# Patient Record
Sex: Female | Born: 1985 | Race: Black or African American | Hispanic: No | Marital: Single | Smoking: Never smoker
Health system: Southern US, Community
[De-identification: ages and names within clinical notes are randomized; demographics above are authoritative.]

## PROBLEM LIST (undated history)

## (undated) ENCOUNTER — Inpatient Hospital Stay (HOSPITAL_COMMUNITY): Payer: Self-pay

## (undated) DIAGNOSIS — R51 Headache: Secondary | ICD-10-CM

## (undated) DIAGNOSIS — R011 Cardiac murmur, unspecified: Secondary | ICD-10-CM

## (undated) DIAGNOSIS — G8929 Other chronic pain: Secondary | ICD-10-CM

## (undated) DIAGNOSIS — E876 Hypokalemia: Secondary | ICD-10-CM

## (undated) DIAGNOSIS — M67439 Ganglion, unspecified wrist: Secondary | ICD-10-CM

## (undated) HISTORY — DX: Hypokalemia: E87.6

## (undated) HISTORY — PX: NO PAST SURGERIES: SHX2092

---

## 2010-04-27 ENCOUNTER — Ambulatory Visit: Payer: Self-pay | Admitting: Radiology

## 2010-04-27 ENCOUNTER — Emergency Department (HOSPITAL_BASED_OUTPATIENT_CLINIC_OR_DEPARTMENT_OTHER): Admission: EM | Admit: 2010-04-27 | Discharge: 2010-04-27 | Payer: Self-pay | Admitting: Rheumatology

## 2010-12-02 LAB — URINE CULTURE

## 2010-12-02 LAB — URINALYSIS, ROUTINE W REFLEX MICROSCOPIC
Hgb urine dipstick: NEGATIVE
Urobilinogen, UA: 1 mg/dL (ref 0.0–1.0)
pH: 6 (ref 5.0–8.0)

## 2010-12-02 LAB — PREGNANCY, URINE: Preg Test, Ur: NEGATIVE

## 2010-12-02 LAB — URINE MICROSCOPIC-ADD ON

## 2011-01-27 ENCOUNTER — Emergency Department (HOSPITAL_BASED_OUTPATIENT_CLINIC_OR_DEPARTMENT_OTHER)
Admission: EM | Admit: 2011-01-27 | Discharge: 2011-01-27 | Disposition: A | Discharge status: Home / Self Care | Payer: Self-pay | Attending: Emergency Medicine | Admitting: Emergency Medicine

## 2011-01-27 DIAGNOSIS — A499 Bacterial infection, unspecified: Secondary | ICD-10-CM | POA: Insufficient documentation

## 2011-01-27 DIAGNOSIS — B9689 Other specified bacterial agents as the cause of diseases classified elsewhere: Secondary | ICD-10-CM | POA: Insufficient documentation

## 2011-01-27 DIAGNOSIS — N760 Acute vaginitis: Secondary | ICD-10-CM | POA: Insufficient documentation

## 2011-01-27 LAB — URINALYSIS, ROUTINE W REFLEX MICROSCOPIC
Bilirubin Urine: NEGATIVE
Glucose, UA: NEGATIVE mg/dL
Hgb urine dipstick: NEGATIVE
Ketones, ur: NEGATIVE mg/dL
Nitrite: NEGATIVE
Protein, ur: NEGATIVE mg/dL
Specific Gravity, Urine: 1.017 (ref 1.005–1.030)
Urobilinogen, UA: 0.2 mg/dL (ref 0.0–1.0)
pH: 7 (ref 5.0–8.0)

## 2011-01-27 LAB — WET PREP, GENITAL
Trich, Wet Prep: NONE SEEN
Yeast Wet Prep HPF POC: NONE SEEN

## 2011-01-27 LAB — PREGNANCY, URINE: Preg Test, Ur: NEGATIVE

## 2011-01-27 LAB — URINE MICROSCOPIC-ADD ON

## 2011-01-28 LAB — GC/CHLAMYDIA PROBE AMP, GENITAL
Chlamydia, DNA Probe: NEGATIVE
GC Probe Amp, Genital: NEGATIVE

## 2011-03-05 ENCOUNTER — Emergency Department (INDEPENDENT_AMBULATORY_CARE_PROVIDER_SITE_OTHER): Payer: Self-pay

## 2011-03-05 ENCOUNTER — Emergency Department (HOSPITAL_BASED_OUTPATIENT_CLINIC_OR_DEPARTMENT_OTHER)
Admission: EM | Admit: 2011-03-05 | Discharge: 2011-03-05 | Disposition: A | Discharge status: Home / Self Care | Payer: Self-pay | Attending: Emergency Medicine | Admitting: Emergency Medicine

## 2011-03-05 DIAGNOSIS — R6884 Jaw pain: Secondary | ICD-10-CM

## 2011-03-05 DIAGNOSIS — M542 Cervicalgia: Secondary | ICD-10-CM | POA: Insufficient documentation

## 2011-08-27 ENCOUNTER — Emergency Department (HOSPITAL_BASED_OUTPATIENT_CLINIC_OR_DEPARTMENT_OTHER)
Admission: EM | Admit: 2011-08-27 | Discharge: 2011-08-27 | Discharge status: Against Medical Advice | Payer: PRIVATE HEALTH INSURANCE | Attending: Emergency Medicine | Admitting: Emergency Medicine

## 2011-08-27 ENCOUNTER — Encounter: Payer: Self-pay | Admitting: *Deleted

## 2011-08-27 DIAGNOSIS — R109 Unspecified abdominal pain: Secondary | ICD-10-CM | POA: Insufficient documentation

## 2011-08-27 NOTE — ED Notes (Addendum)
Abd pain all day. States its not crampy or sharp. States that she has been nauseas today. Hasn't had a period in 3 months and is not currently on [facility]. Denies any other symptoms.

## 2011-09-02 ENCOUNTER — Encounter (HOSPITAL_BASED_OUTPATIENT_CLINIC_OR_DEPARTMENT_OTHER): Payer: Self-pay | Admitting: *Deleted

## 2011-09-02 ENCOUNTER — Emergency Department (HOSPITAL_BASED_OUTPATIENT_CLINIC_OR_DEPARTMENT_OTHER)
Admission: EM | Admit: 2011-09-02 | Discharge: 2011-09-02 | Disposition: A | Discharge status: Home / Self Care | Payer: PRIVATE HEALTH INSURANCE | Attending: Emergency Medicine | Admitting: Emergency Medicine

## 2011-09-02 DIAGNOSIS — B9689 Other specified bacterial agents as the cause of diseases classified elsewhere: Secondary | ICD-10-CM | POA: Insufficient documentation

## 2011-09-02 DIAGNOSIS — N390 Urinary tract infection, site not specified: Secondary | ICD-10-CM | POA: Insufficient documentation

## 2011-09-02 DIAGNOSIS — N760 Acute vaginitis: Secondary | ICD-10-CM | POA: Insufficient documentation

## 2011-09-02 DIAGNOSIS — A499 Bacterial infection, unspecified: Secondary | ICD-10-CM | POA: Insufficient documentation

## 2011-09-02 DIAGNOSIS — R109 Unspecified abdominal pain: Secondary | ICD-10-CM | POA: Insufficient documentation

## 2011-09-02 LAB — URINALYSIS, ROUTINE W REFLEX MICROSCOPIC
Glucose, UA: NEGATIVE mg/dL
Ketones, ur: 15 mg/dL — AB
Specific Gravity, Urine: 1.039 — ABNORMAL HIGH (ref 1.005–1.030)
Urobilinogen, UA: 1 mg/dL (ref 0.0–1.0)

## 2011-09-02 LAB — URINE MICROSCOPIC-ADD ON

## 2011-09-02 LAB — WET PREP, GENITAL
Trich, Wet Prep: NONE SEEN
Yeast Wet Prep HPF POC: NONE SEEN

## 2011-09-02 MED ORDER — METRONIDAZOLE 500 MG PO TABS: 500.0000 mg | ORAL_TABLET | Freq: Two times a day (BID) | ORAL | Status: AC

## 2011-09-02 MED ORDER — SULFAMETHOXAZOLE-TRIMETHOPRIM 800-160 MG PO TABS: 1.0000 | ORAL_TABLET | Freq: Two times a day (BID) | ORAL | Status: AC

## 2011-09-02 NOTE — ED Notes (Signed)
Pt states she has had low abd and back pain for a couple weeks. Frequency for a couple days. Regular period in Oct, missed Nov, spotted in Dec. Denies discharge.

## 2011-09-02 NOTE — ED Provider Notes (Signed)
Medical screening examination/treatment/procedure(s) were performed by non-physician practitioner and as supervising physician I was immediately available for consultation/collaboration.  Jasmine Awe, MD 09/02/11 [phone]

## 2011-09-02 NOTE — ED Notes (Signed)
D/c home with escript x 2 for flagyl and septra

## 2011-09-02 NOTE — ED Notes (Signed)
Pelvic exam complete- pending results

## 2011-09-02 NOTE — ED Provider Notes (Signed)
History     CSN: [id number] Arrival date & time: 09/02/2011  8:05 PM   First MD Initiated Contact with Patient 09/02/11 2011      Chief Complaint  Patient presents with  . Abdominal Pain    (Consider location/radiation/quality/duration/timing/severity/associated sxs/prior treatment) Patient is a 25 y.o. female presenting with abdominal pain. The history is provided by the patient.  Abdominal Pain The primary symptoms of the illness include abdominal pain. The primary symptoms of the illness do not include fever, nausea or vomiting. The current episode started more than 2 days ago. The onset of the illness was gradual. The problem has not changed since onset. The patient states that she believes she is currently not pregnant. The patient has not had a change in bowel habit. Additional symptoms associated with the illness include frequency and back pain. Symptoms associated with the illness do not include urgency.    History reviewed. No pertinent past medical history.  History reviewed. No pertinent past surgical history.  History reviewed. No pertinent family history.  History  Substance Use Topics  . Smoking status: Never Smoker   . Smokeless tobacco: Not on file  . Alcohol Use: No    OB History    Grav Para Term Preterm Abortions TAB SAB Ect Mult Living                  Review of Systems  Constitutional: Negative for fever.  Gastrointestinal: Positive for abdominal pain. Negative for nausea and vomiting.  Genitourinary: Positive for frequency. Negative for urgency.  Musculoskeletal: Positive for back pain.  All other systems reviewed and are negative.    Allergies  Amoxicillin  Home Medications   Current Outpatient Rx  Name Route Sig Dispense Refill  . IBUPROFEN 200 MG PO TABS Oral Take 400 mg by mouth every 6 (six) hours as needed. For pain     . ADULT MULTIVITAMIN W/MINERALS CH Oral Take 1 tablet by mouth daily.      Marland Kitchen METRONIDAZOLE 500 MG PO TABS Oral  Take 1 tablet (500 mg total) by mouth 2 (two) times daily. 14 tablet 0  . SULFAMETHOXAZOLE-TRIMETHOPRIM 800-160 MG PO TABS Oral Take 1 tablet by mouth 2 (two) times daily. 6 tablet 0    BP 128/91  Pulse 90  Temp(Src) 98.5 F (36.9 C) (Oral)  Resp 20  Ht 5\' 4"  (1.626 m)  Wt 148 lb (67.132 kg)  BMI 25.40 kg/m2  SpO2 100%  LMP 06/27/2011  Physical Exam  Nursing note and vitals reviewed. Constitutional: She appears well-developed and well-nourished.  HENT:  Head: Atraumatic.  Cardiovascular: Normal rate and regular rhythm.   Pulmonary/Chest: Effort normal and breath sounds normal.  Abdominal: Soft. Bowel sounds are normal. There is no CVA tenderness.  Genitourinary:       Pt has a white malodorous discharge:pt has no cmt   Musculoskeletal: Normal range of motion.    ED Course  Procedures (including critical care time)  Labs Reviewed  URINALYSIS, ROUTINE W REFLEX MICROSCOPIC - Abnormal; Notable for the following:    Color, Urine AMBER (*) BIOCHEMICALS MAY BE AFFECTED BY COLOR   APPearance CLOUDY (*)    Specific Gravity, Urine 1.039 (*)    Hgb urine dipstick SMALL (*)    Bilirubin Urine SMALL (*)    Ketones, ur 15 (*)    Protein, ur 100 (*)    Leukocytes, UA SMALL (*)    All other components within normal limits  WET PREP, GENITAL - Abnormal; Notable  for the following:    Clue Cells, Wet Prep MANY (*)    WBC, Wet Prep HPF POC TOO NUMEROUS TO COUNT (*)    All other components within normal limits  URINE MICROSCOPIC-ADD ON - Abnormal; Notable for the following:    Squamous Epithelial / LPF FEW (*)    Bacteria, UA MANY (*)    All other components within normal limits  PREGNANCY, URINE  GC/CHLAMYDIA PROBE AMP, GENITAL   No results found.   1. BV (bacterial vaginosis)   2. UTI (lower urinary tract infection)       MDM  Will treat for bv and uti:not consistent with pyelo:exam not consistent with pid       Teressa Lower, NP 09/02/11 2141  Teressa Lower, NP 09/02/11 2142

## 2011-09-05 LAB — GC/CHLAMYDIA PROBE AMP, GENITAL: GC Probe Amp, Genital: NEGATIVE

## 2012-03-12 ENCOUNTER — Encounter (HOSPITAL_BASED_OUTPATIENT_CLINIC_OR_DEPARTMENT_OTHER): Payer: Self-pay | Admitting: *Deleted

## 2012-03-12 ENCOUNTER — Emergency Department (HOSPITAL_BASED_OUTPATIENT_CLINIC_OR_DEPARTMENT_OTHER)
Admission: EM | Admit: 2012-03-12 | Discharge: 2012-03-12 | Discharge status: Against Medical Advice | Payer: Self-pay | Attending: Emergency Medicine | Admitting: Emergency Medicine

## 2012-03-12 DIAGNOSIS — R110 Nausea: Secondary | ICD-10-CM | POA: Insufficient documentation

## 2012-03-12 LAB — URINALYSIS, ROUTINE W REFLEX MICROSCOPIC
Hgb urine dipstick: NEGATIVE
Ketones, ur: 15 mg/dL — AB
Urobilinogen, UA: 1 mg/dL (ref 0.0–1.0)
pH: 7 (ref 5.0–8.0)

## 2012-03-12 LAB — PREGNANCY, URINE: Preg Test, Ur: NEGATIVE

## 2012-03-12 LAB — URINE MICROSCOPIC-ADD ON

## 2012-03-12 NOTE — ED Notes (Signed)
Nausea x 1 week. No other complaints at triage.

## 2012-03-12 NOTE — ED Notes (Signed)
Pt. Was placed in room with gown offered to her.  Pt. Reports she has just nausea upon assessment of Pt.
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
## 2012-08-01 ENCOUNTER — Telehealth: Payer: Self-pay | Admitting: Obstetrics and Gynecology

## 2012-08-01 NOTE — Telephone Encounter (Signed)
TC FROM PT REGARDING WHAT TO TAKE FOR MOTION SICKNESS. PT ASKED COULD SHE TAKE A MED THAT I WAS NOT FAMILIAR WITH AND I TOLD PT THAT DRAMAMINE IS A CAT B   WHICH MEANS IT IS SAFE FOR PREGNANCY. PT VOICED UNDERSTANDING.

## 2012-08-05 ENCOUNTER — Telehealth: Payer: Self-pay | Admitting: Obstetrics and Gynecology

## 2012-08-05 ENCOUNTER — Encounter: Payer: Self-pay | Admitting: Obstetrics and Gynecology

## 2012-08-05 ENCOUNTER — Ambulatory Visit (INDEPENDENT_AMBULATORY_CARE_PROVIDER_SITE_OTHER): Payer: Medicaid Other | Admitting: Obstetrics and Gynecology

## 2012-08-05 DIAGNOSIS — Z331 Pregnant state, incidental: Secondary | ICD-10-CM

## 2012-08-05 DIAGNOSIS — O26849 Uterine size-date discrepancy, unspecified trimester: Secondary | ICD-10-CM

## 2012-08-05 LAB — POCT URINALYSIS DIPSTICK
Bilirubin, UA: NEGATIVE
Glucose, UA: NEGATIVE
Spec Grav, UA: 1.02

## 2012-08-05 NOTE — Telephone Encounter (Signed)
TC to pt. Informed of appt for U/S and F/ U. Pt verbalizes comprehension.

## 2012-08-05 NOTE — Telephone Encounter (Signed)
Tc TO pt regarding U/S appt.   LM to return call.

## 2012-08-05 NOTE — Progress Notes (Signed)
NOB Interview.  Pt was seen at [facility] ER x 2. Last visit was 08/03/12 for spotting at which time had U/S. States was given Metronidazole for BV, Mycolog for yeast infection and Amoxicillin for UTI. Has not yet obtained medication.  Instructed NOT to take Amoxicillin due to documented allergy with anaphylactic reaction. Denies UTI sx.  Urine sent for culture. 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 Restriction letter given. 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 To check with employer about exposure. Is aware of certain areas of plant to avoid. Reviewed pt's hx and U/S by phone with VL. Pt to have viability U/S.

## 2012-08-06 ENCOUNTER — Telehealth: Payer: Self-pay | Admitting: Obstetrics and Gynecology

## 2012-08-06 LAB — PRENATAL PANEL VII
Antibody Screen: NEGATIVE
Basophils Absolute: 0 10*3/uL (ref 0.0–0.1)
Eosinophils Absolute: 0.1 10*3/uL (ref 0.0–0.7)
HIV: NONREACTIVE
Hepatitis B Surface Ag: NEGATIVE
Lymphocytes Relative: 30 % (ref 12–46)
MCHC: 33.1 g/dL (ref 30.0–36.0)
MCV: 80.9 fL (ref 78.0–100.0)
Monocytes Absolute: 0.4 10*3/uL (ref 0.1–1.0)
Platelets: 336 10*3/uL (ref 150–400)
RDW: 14.3 % (ref 11.5–15.5)
WBC: 5.1 10*3/uL (ref 4.0–10.5)

## 2012-08-06 NOTE — Telephone Encounter (Signed)
TC to pt.  States wants to get Rx for UTI  And yeast infection changed.  Explained urine culture done 08/05/12. Results should be available 08/07/12. Will need to treat  According to results to be sure appropriate med is given if needed. To discuss at visit.  Pt verbalizes comprehension.

## 2012-08-07 ENCOUNTER — Ambulatory Visit (INDEPENDENT_AMBULATORY_CARE_PROVIDER_SITE_OTHER): Payer: Medicaid Other | Admitting: Obstetrics and Gynecology

## 2012-08-07 ENCOUNTER — Encounter: Payer: Self-pay | Admitting: Obstetrics and Gynecology

## 2012-08-07 ENCOUNTER — Ambulatory Visit (INDEPENDENT_AMBULATORY_CARE_PROVIDER_SITE_OTHER): Payer: Medicaid Other

## 2012-08-07 VITALS — BP 120/70 | Wt 147.0 lb

## 2012-08-07 DIAGNOSIS — N760 Acute vaginitis: Secondary | ICD-10-CM

## 2012-08-07 DIAGNOSIS — B373 Candidiasis of vulva and vagina: Secondary | ICD-10-CM

## 2012-08-07 DIAGNOSIS — B3731 Acute candidiasis of vulva and vagina: Secondary | ICD-10-CM

## 2012-08-07 DIAGNOSIS — O26849 Uterine size-date discrepancy, unspecified trimester: Secondary | ICD-10-CM

## 2012-08-07 DIAGNOSIS — B9689 Other specified bacterial agents as the cause of diseases classified elsewhere: Secondary | ICD-10-CM

## 2012-08-07 DIAGNOSIS — A499 Bacterial infection, unspecified: Secondary | ICD-10-CM

## 2012-08-07 DIAGNOSIS — O209 Hemorrhage in early pregnancy, unspecified: Secondary | ICD-10-CM

## 2012-08-07 DIAGNOSIS — N390 Urinary tract infection, site not specified: Secondary | ICD-10-CM

## 2012-08-07 DIAGNOSIS — N898 Other specified noninflammatory disorders of vagina: Secondary | ICD-10-CM

## 2012-08-07 LAB — POCT WET PREP (WET MOUNT)
Clue Cells Wet Prep Whiff POC: POSITIVE
pH: 5

## 2012-08-07 LAB — POCT OSOM TRICHOMONAS RAPID TEST: Trichomonas vaginalis: NEGATIVE

## 2012-08-07 LAB — POCT URINALYSIS DIPSTICK
Bilirubin, UA: NEGATIVE
Blood, UA: NEGATIVE
Nitrite, UA: NEGATIVE
pH, UA: 6

## 2012-08-07 LAB — CULTURE, OB URINE: Colony Count: NO GROWTH

## 2012-08-07 LAB — HEMOGLOBINOPATHY EVALUATION
Hemoglobin Other: 0 %
Hgb S Quant: 0 %

## 2012-08-07 MED ORDER — TERCONAZOLE 0.4 % VA CREA: TOPICAL_CREAM | VAGINAL | Status: DC

## 2012-08-07 MED ORDER — METRONIDAZOLE 0.75 % VA GEL: VAGINAL | Status: DC

## 2012-08-07 NOTE — Progress Notes (Signed)
GYN PROBLEM VISIT  [name] is a 26 y.o. year old female,G1P0, who presents for a problem visit.    [redacted]w[redacted]d by LMP of 06/17/12  Pt had abdominal pain, and a spreading rash, and was seen at [facility] where she was dx on 08/03/2012 with UTI, BV and yeast infection Pt stated that she has not filled any of the prescriptions for UTI or yeast infection. Pt stated that she did not take otc med for yeast infection because medicaid did not pay for it, was unable to tolerate oral Metronidazole and that she was allergic to Amoxicillin prescribed for UTI. No UTI symptoms today, but does complain of vaginal discharge, and feels rash is getting worse Pt declines flu vaccine.     Objective:  BP 120/70  Wt 162 lb (73.483 kg)  LMP 06/17/2012   ULTRASOUND: Retroflexed uterus. Singleton pregnancy. +FHTs/FHR-156bpm. Amnion and yolk sac seen Measurements c/w LMP GA There is a small subchorionic hemorrhage seen. Measures: 1.6cmx0.66cmx0.71cm Cx closed. Normal ovaries/adnexa Corpus luteum on LTOV  General exam:  Circular flaking areas 2-5 mm in diameter over both forearms and right torso  PELVIC EXAM: External genitalia: normal general appearance Vaginal: normal mucosa without prolapse or lesions.  Copious white discharge Cervix: normal appearance and No tenderness to lateral motion Adnexa: No masses Uterus: ULNS, non tender Rectal: deferred  URINE CULTURE:from 08/05/12:  Neg OSCOM BV: pos OSCOM TRICH: neg WET PREP: neg trich. Pos yeast and clue cells.  PH 5.0  Assessment: IUP at 7w 0d BV Yeast Dermatitis Subchorionic hemorrhage, no bleeding now  Plan: Metrogel Terazol 7 PNV samples Counseled re [facility] Dermatology consult Return to office in 4 week(s). For NOB as scheduled or prn heavy vaginal bleeding   [name], MD  08/07/2012 12:27 PM

## 2012-08-08 ENCOUNTER — Telehealth: Payer: Self-pay

## 2012-08-08 LAB — US OB TRANSVAGINAL

## 2012-08-08 NOTE — Telephone Encounter (Signed)
Tc to pt. Derm referral appt sched 08/22/12 @3 :30p(arrive 3:15p) with Joneen Caraway @ [facility]. Pt agrees.

## 2012-08-14 ENCOUNTER — Telehealth: Payer: Self-pay | Admitting: Obstetrics and Gynecology

## 2012-08-14 MED ORDER — PROMETHAZINE HCL 25 MG PO TABS: 25.0000 mg | ORAL_TABLET | Freq: Four times a day (QID) | ORAL | Status: DC | PRN

## 2012-08-14 NOTE — Telephone Encounter (Signed)
VM left by pt requesting change in med instead of Zofran. TC to pt . States Zofran constipation. Prefers Phenergan even though is aware may cause drowsiness.  No BM x > 1 wk Suggested Dulcolax suppository, then Miralax or Colace. Pt verbalizes comprehension. Per VL, OK for Phenergan.

## 2012-08-28 ENCOUNTER — Ambulatory Visit (INDEPENDENT_AMBULATORY_CARE_PROVIDER_SITE_OTHER): Payer: Medicaid Other | Admitting: Obstetrics and Gynecology

## 2012-08-28 ENCOUNTER — Telehealth: Payer: Self-pay | Admitting: Obstetrics and Gynecology

## 2012-08-28 ENCOUNTER — Encounter: Payer: Self-pay | Admitting: Obstetrics and Gynecology

## 2012-08-28 VITALS — BP 120/72 | Wt 146.0 lb

## 2012-08-28 DIAGNOSIS — Z369 Encounter for antenatal screening, unspecified: Secondary | ICD-10-CM

## 2012-08-28 DIAGNOSIS — Z331 Pregnant state, incidental: Secondary | ICD-10-CM

## 2012-08-28 DIAGNOSIS — Z880 Allergy status to penicillin: Secondary | ICD-10-CM

## 2012-08-28 DIAGNOSIS — Z36 Encounter for antenatal screening of mother: Secondary | ICD-10-CM

## 2012-08-28 MED ORDER — PRENATAL MULTIVITAMIN CH: 1.0000 | ORAL_TABLET | Freq: Every day | ORAL | Status: DC

## 2012-08-28 NOTE — Progress Notes (Signed)
[redacted]w[redacted]d  Last Pap: at planned parent hood  05/2012 "WNL"  Pt requested Genetic Testing. Pt states she need Rx a for PNV's .  Pt < than 16 week FHTs will be done [facility] .
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
## 2012-08-29 MED ORDER — SE-NATAL 19 29-1 MG PO CHEW: 1.0000 | CHEWABLE_TABLET | Freq: Every day | ORAL | Status: DC

## 2012-08-29 NOTE — Telephone Encounter (Signed)
Tc to pt. Rx for PNV's e-pres to pharm on file. Pt agrees.

## 2012-09-10 ENCOUNTER — Encounter: Payer: Medicaid Other | Admitting: Obstetrics and Gynecology

## 2012-09-12 ENCOUNTER — Encounter: Payer: Medicaid Other | Admitting: Obstetrics and Gynecology

## 2012-09-18 NOTE — L&D Delivery Note (Signed)
Delivery Note At 11:41 PM a viable female, "Tracey Leonard", was delivered via Vaginal, Spontaneous Delivery (Presentation: Right Occiput Anterior).  APGAR: 8, 9; weight .   Placenta status: Intact, Spontaneous.  Cord: 3 vessels with the following complications: None.  Cord pH: NA Placenta and baby had foul odor at delivery.   Marginal insertion of cord noted.  Patient was afebrile prior to delivery, then 100.2 orally just after delivery.   Filed Vitals:   03/22/13 2301 03/22/13 2354 03/23/13 0002 03/23/13 0015  BP: 142/79 132/68 144/71 131/63  Pulse: 107 105 111 111  Temp:    100.2 F (37.9 C)  TempSrc:    Oral  Resp:      Height:      Weight:      SpO2:        Anesthesia: Epidural  Episiotomy: None Lacerations: 2nd degree Suture Repair: monocryl Est. Blood Loss (mL): 250  Mom to postpartum.  Baby to skin to skin.  Per anesthesia, CBC after delivery.  Will add CMP, LDH, and uric acid to that draw (not done previously). Will monitor temp at present--likely early chorioamnionitis. Will monitor BP pp. Will defer ATB unless temp persists. Placenta to path.Marland Kitchen  Brewster Wolters 03/23/2013, 12:20 AM

## 2012-09-23 ENCOUNTER — Other Ambulatory Visit: Payer: Self-pay | Admitting: Obstetrics and Gynecology

## 2012-09-23 DIAGNOSIS — Z369 Encounter for antenatal screening, unspecified: Secondary | ICD-10-CM

## 2012-09-24 ENCOUNTER — Encounter: Payer: Self-pay | Admitting: Obstetrics and Gynecology

## 2012-09-24 ENCOUNTER — Ambulatory Visit (INDEPENDENT_AMBULATORY_CARE_PROVIDER_SITE_OTHER): Payer: Medicaid Other | Admitting: Obstetrics and Gynecology

## 2012-09-24 ENCOUNTER — Ambulatory Visit (INDEPENDENT_AMBULATORY_CARE_PROVIDER_SITE_OTHER): Payer: Medicaid Other

## 2012-09-24 VITALS — BP 126/78 | Wt 146.0 lb

## 2012-09-24 DIAGNOSIS — Z36 Encounter for antenatal screening of mother: Secondary | ICD-10-CM

## 2012-09-24 DIAGNOSIS — Z331 Pregnant state, incidental: Secondary | ICD-10-CM

## 2012-09-24 DIAGNOSIS — Z369 Encounter for antenatal screening, unspecified: Secondary | ICD-10-CM

## 2012-09-24 DIAGNOSIS — Z3400 Encounter for supervision of normal first pregnancy, unspecified trimester: Secondary | ICD-10-CM

## 2012-09-24 DIAGNOSIS — Z1389 Encounter for screening for other disorder: Secondary | ICD-10-CM

## 2012-09-24 DIAGNOSIS — Z23 Encounter for immunization: Secondary | ICD-10-CM

## 2012-09-24 NOTE — Progress Notes (Signed)
[redacted]w[redacted]d 1st tri screen today C/o rash on arms again - refer back to [place] dermatologist Tooth is hurting her - refer to dentis Anatomy scan at [facility] in 4wks and AFP

## 2012-09-25 LAB — US OB COMP LESS 14 WKS

## 2012-09-27 ENCOUNTER — Telehealth: Payer: Self-pay | Admitting: Obstetrics and Gynecology

## 2012-09-27 NOTE — Telephone Encounter (Signed)
Spoke with pt rgs msg. Pt stated she wanted a sample of se-natal prenatal vit. Pt stated that her insure will not approve of rx. Advised pt would give her some samples. Pt stated that she will pick up the samples on Monday.

## 2012-09-30 ENCOUNTER — Telehealth: Payer: Self-pay | Admitting: Obstetrics and Gynecology

## 2012-09-30 ENCOUNTER — Encounter: Payer: Medicaid Other | Admitting: Obstetrics and Gynecology

## 2012-09-30 NOTE — Telephone Encounter (Signed)
Tc to pt per telephone call. Pt c/o white d/c with itching. No odor. Pt has tried otc antifungal meds without improvement. Appt sched 10/04/12 @ 12:00p with SL for eval. Pt agrees.

## 2012-10-01 ENCOUNTER — Encounter: Payer: Self-pay | Admitting: Obstetrics and Gynecology

## 2012-10-04 ENCOUNTER — Ambulatory Visit: Payer: Medicaid Other | Admitting: Obstetrics and Gynecology

## 2012-10-04 VITALS — BP 110/68 | Wt 147.0 lb

## 2012-10-04 DIAGNOSIS — N898 Other specified noninflammatory disorders of vagina: Secondary | ICD-10-CM

## 2012-10-04 MED ORDER — NYSTATIN-TRIAMCINOLONE 100000-0.1 UNIT/GM-% EX OINT: TOPICAL_OINTMENT | Freq: Two times a day (BID) | CUTANEOUS | Status: DC

## 2012-10-04 NOTE — Progress Notes (Signed)
[redacted]w[redacted]d C/o "yeast infection"  Sx's 2 weeks, pharmacy told her to use hydrocortisone  Difficulty hearing FHT's, BSUS lot of FM, +cardiac activity  Vulva erythema, excoriation  Spec exam clear, scant amt white d/c, malodorous Wet prep neg x3  rx mycolog rv'd comfort measures RTO as scheduled

## 2012-10-04 NOTE — Progress Notes (Signed)
C/o white discharge, itching no odor. Pt stated no other issues. Pt cant void at this time

## 2012-10-07 ENCOUNTER — Telehealth: Payer: Self-pay | Admitting: Obstetrics and Gynecology

## 2012-10-07 NOTE — Telephone Encounter (Signed)
Pt called, states went to [facility] office and are telling the pt that she is not gaining enough weight.  Pt has not had a loss of appetite and is eating 3 meals a day with snacks.  Pt gained a pound between her last 2 visits.  They want to give her Boost.  Pt advised to wait until her next visit for our office to check her weight and can mention their concerns at that time and will address if needed.  Pt voices agreement and will call with any concerns prior to next appt.

## 2012-10-21 ENCOUNTER — Telehealth: Payer: Self-pay | Admitting: Obstetrics and Gynecology

## 2012-10-21 NOTE — Telephone Encounter (Signed)
Dental letter faxed.  Parminder Cupples, CMA  

## 2012-10-22 ENCOUNTER — Ambulatory Visit: Payer: Medicaid Other

## 2012-10-22 ENCOUNTER — Ambulatory Visit: Payer: Medicaid Other | Admitting: Obstetrics and Gynecology

## 2012-10-22 VITALS — BP 120/60 | Wt 150.5 lb

## 2012-10-22 DIAGNOSIS — N390 Urinary tract infection, site not specified: Secondary | ICD-10-CM

## 2012-10-22 DIAGNOSIS — Z331 Pregnant state, incidental: Secondary | ICD-10-CM

## 2012-10-22 DIAGNOSIS — R8290 Unspecified abnormal findings in urine: Secondary | ICD-10-CM

## 2012-10-22 DIAGNOSIS — Z1389 Encounter for screening for other disorder: Secondary | ICD-10-CM

## 2012-10-22 DIAGNOSIS — IMO0002 Reserved for concepts with insufficient information to code with codable children: Secondary | ICD-10-CM

## 2012-10-22 LAB — POCT URINALYSIS DIPSTICK
Spec Grav, UA: 1.02
Urobilinogen, UA: NEGATIVE

## 2012-10-22 NOTE — Progress Notes (Signed)
[redacted]w[redacted]d Ultrasound shows:  SIUP  S=D     [place] EDD: 03/24/2013            AFI: Vertical pocket=5.5cm           Cervical length: not measured            Placenta localization: posterior           Fetal presentation: frank breech                   Anatomy survey is nl.     Marginal placental cord insertion(1.3 cm cm from edge)           Gender : female Will repeat U/S for growth at 24 wks

## 2012-10-23 LAB — CULTURE, OB URINE: Colony Count: NO GROWTH

## 2012-10-24 LAB — US OB COMP + 14 WK

## 2012-11-04 ENCOUNTER — Telehealth: Payer: Self-pay | Admitting: Obstetrics and Gynecology

## 2012-11-04 NOTE — Telephone Encounter (Signed)
VM from pt. Has question  About Rx. [phone]

## 2012-11-05 NOTE — Telephone Encounter (Signed)
Spoke with pt rgd msg pt states [facility] states need rx for boost supplement pt states had rx from [facility] but no refills advised pt will mail rx pt voice understanding

## 2012-11-05 NOTE — Telephone Encounter (Signed)
Lm on vm to cb rgd msg

## 2012-11-07 ENCOUNTER — Telehealth: Payer: Self-pay | Admitting: Obstetrics and Gynecology

## 2012-11-08 ENCOUNTER — Telehealth: Payer: Self-pay | Admitting: Obstetrics and Gynecology

## 2012-11-08 NOTE — Telephone Encounter (Signed)
VM from pt . Calling about Rx [phone]

## 2012-11-08 NOTE — Telephone Encounter (Signed)
Pt called, states she needs a rx for boost because pt states she needs it because she is only eating one meal a day and she is not taking her prenatal vitamins.  Pt has also made it a point to say that when she has gone to the hospital to be weighed, they were telling her that she is losing weight and that her weight done in the office is not accurate because she has her clothes on.  Tc to Florentina Addison, Nutritionist @ [facility] for clarification of what the pt is wanting.  Florentina Addison says, for the pt to receive boost their needs to be a medical dx such as hyperemesis or IUGR.  Katie faxed the form to be completed by a provider.  Pt informed of this and offered her an appt to discuss.  Pt has an appt on 11/19/12, will possibly discuss @ that time, but pt also states she is thinking about switching practices.

## 2012-11-18 ENCOUNTER — Encounter (HOSPITAL_BASED_OUTPATIENT_CLINIC_OR_DEPARTMENT_OTHER): Payer: Self-pay | Admitting: Emergency Medicine

## 2012-11-18 ENCOUNTER — Telehealth: Payer: Self-pay | Admitting: Obstetrics and Gynecology

## 2012-11-18 ENCOUNTER — Emergency Department (HOSPITAL_BASED_OUTPATIENT_CLINIC_OR_DEPARTMENT_OTHER)
Admission: EM | Admit: 2012-11-18 | Discharge: 2012-11-18 | Disposition: A | Discharge status: Home / Self Care | Payer: Medicaid Other | Attending: Emergency Medicine | Admitting: Emergency Medicine

## 2012-11-18 ENCOUNTER — Emergency Department (HOSPITAL_COMMUNITY)
Admission: EM | Admit: 2012-11-18 | Discharge: 2012-11-18 | Discharge status: Against Medical Advice | Payer: Self-pay | Attending: Emergency Medicine | Admitting: Emergency Medicine

## 2012-11-18 DIAGNOSIS — O9989 Other specified diseases and conditions complicating pregnancy, childbirth and the puerperium: Secondary | ICD-10-CM | POA: Insufficient documentation

## 2012-11-18 DIAGNOSIS — Z87440 Personal history of urinary (tract) infections: Secondary | ICD-10-CM | POA: Insufficient documentation

## 2012-11-18 DIAGNOSIS — R040 Epistaxis: Secondary | ICD-10-CM | POA: Insufficient documentation

## 2012-11-18 NOTE — ED Notes (Signed)
MD at bedside. ENT cart to room. Silver nitrate used to stop the bleeding from right nare

## 2012-11-18 NOTE — Telephone Encounter (Signed)
TC to pt. States was seen at [facility] ER this am for nose bleed. Was treated and advised to return if sx recur. States for 4 hours is continuing to have bleeding filling tissue every 20 min.Advised to return to ER. Pt verbalizes comprehension.

## 2012-11-18 NOTE — ED Provider Notes (Signed)
History     CSN: [id number]  Arrival date & time 11/18/12  0630   First MD Initiated Contact with Patient 11/18/12 [phone]      No chief complaint on file.   (Consider location/radiation/quality/duration/timing/severity/associated sxs/prior treatment) Patient is a 27 y.o. female presenting with nosebleeds. The history is provided by the patient.  Epistaxis  This is a recurrent problem. The current episode started less than 1 hour ago. The problem occurs constantly. The problem has been gradually improving. The problem is associated with an unknown factor. The bleeding has been from the right nare. She has tried nothing for the symptoms. The treatment provided moderate relief. Her past medical history does not include bleeding disorder or sinus problems.    Past Medical History  Diagnosis Date  . Infection     UTI  07/15/12  . Infection     07/2012  . Infection     07/2012    Past Surgical History  Procedure Laterality Date  . No past surgeries      Family History  Problem Relation Age of Onset  . Other Father     MVA    History  Substance Use Topics  . Smoking status: Never Smoker   . Smokeless tobacco: Never Used  . Alcohol Use: No    OB History   Grav Para Term Preterm Abortions TAB SAB Ect Mult Living   1               Review of Systems  HENT: Positive for nosebleeds. Negative for sore throat.   All other systems reviewed and are negative.    Allergies  Amoxicillin  Home Medications   Current Outpatient Rx  Name  Route  Sig  Dispense  Refill  . ibuprofen (ADVIL,MOTRIN) 200 MG tablet   Oral   Take 400 mg by mouth every 6 (six) hours as needed. For pain          . metroNIDAZOLE (METROGEL VAGINAL) 0.75 % vaginal gel      Pt to insert 1 applicator full per vagina qhs x 5days   70 g   0   . Multiple Vitamin (MULITIVITAMIN WITH MINERALS) TABS   Oral   Take 1 tablet by mouth daily.           Marland Kitchen nystatin-triamcinolone ointment (MYCOLOG)    Topical   Apply topically 2 (two) times daily.   30 g   0   . ondansetron (ZOFRAN) 8 MG tablet   Oral   Take by mouth every 8 (eight) hours as needed.         . Prenatal Vit-Fe Fumarate-FA (PRENATAL MULTIVITAMIN) TABS   Oral   Take 1 tablet by mouth daily.   30 tablet   11   . Prenatal Vit-Fe Fumarate-FA (SE-NATAL 19) 29-1 MG CHEW   Oral   Chew 1 tablet by mouth daily.   30 tablet   12   . promethazine (PHENERGAN) 25 MG tablet   Oral   Take 1 tablet (25 mg total) by mouth every 6 (six) hours as needed for nausea.   36 tablet   1   . terconazole (TERAZOL 7) 0.4 % vaginal cream      Pt to insert 1 applicator full per vagina qhs x 7days after completion of Metrogel   45 g   0   . triamcinolone (KENALOG) 0.025 % cream   Topical   Apply topically 2 (two) times daily.  LMP 06/17/2012  Physical Exam  Constitutional: She is oriented to person, place, and time. She appears well-developed and well-nourished. No distress.  HENT:  Head: Normocephalic and atraumatic.  Nose: No nasal deformity, septal deviation or nasal septal hematoma. Epistaxis is observed.  Mouth/Throat: Oropharynx is clear and moist.  Scant bleeding in right kisselbach's plexus  Eyes: Conjunctivae are normal. Pupils are equal, round, and reactive to light.  Neck: Normal range of motion. Neck supple.  Cardiovascular: Normal rate, regular rhythm and intact distal pulses.   Pulmonary/Chest: Effort normal and breath sounds normal. No respiratory distress. She has no wheezes. She has no rales.  Abdominal: Soft. Bowel sounds are normal.  gravid  Musculoskeletal: Normal range of motion.  Lymphadenopathy:    She has no cervical adenopathy.  Neurological: She is alert and oriented to person, place, and time.  Skin: Skin is warm and dry.  Psychiatric: She has a normal mood and affect.    ED Course  EPISTAXIS MANAGEMENT Date/Time: 11/18/2012 6:29 AM Performed by: Jasmine Awe Authorized by: Jasmine Awe Consent: Verbal consent obtained. Patient identity confirmed: arm band Patient sedated: no Treatment site: right Kiesselbach's area Repair method: silver nitrate Post-procedure assessment: bleeding stopped Treatment complexity: complex Patient tolerance: Patient tolerated the procedure well with no immediate complications.   (including critical care time)  Labs Reviewed - No data to display No results found.   No diagnosis found.    MDM  No trauma, no bleeding from other sites.  No indication for labs at this time.  Advised saline nasal spray 2 times daily.  Return for worsening symptoms       Deshannon Seide K Dossie Ocanas-Rasch, MD 11/18/12 [phone]

## 2012-11-18 NOTE — ED Notes (Addendum)
Pt reports she awoke from sleep with blood on her pillow denies pain denies event that could have caused bleeding denies hx of same. Pt reports she is [redacted] weeks pregnant and has due date July 7th 2014

## 2012-11-18 NOTE — ED Notes (Signed)
Pt awoke from sleep with blood on her pillow denies pain or event leading to bleeding. Denies hx of same and re[ports she is [redacted] weeks pregnant and due date is July 7th 2014

## 2012-11-19 ENCOUNTER — Encounter: Payer: Medicaid Other | Admitting: Obstetrics and Gynecology

## 2012-12-02 ENCOUNTER — Encounter: Payer: Self-pay | Admitting: Obstetrics and Gynecology

## 2012-12-02 ENCOUNTER — Encounter: Payer: Medicaid Other | Admitting: Obstetrics and Gynecology

## 2012-12-02 ENCOUNTER — Ambulatory Visit: Payer: Medicaid Other | Admitting: Obstetrics and Gynecology

## 2012-12-02 VITALS — BP 114/60 | Wt 163.0 lb

## 2012-12-02 DIAGNOSIS — O26849 Uterine size-date discrepancy, unspecified trimester: Secondary | ICD-10-CM

## 2012-12-02 DIAGNOSIS — IMO0002 Reserved for concepts with insufficient information to code with codable children: Secondary | ICD-10-CM

## 2012-12-02 NOTE — Progress Notes (Signed)
[redacted]w[redacted]d Per last note pt was to have growth U/S at 24wks .U/S was not scheduled. - Pt ok with being scheduled next available There are no U/S openings today.  C/O: Nose bleeds Next available for u/s for EFW secondary to marginal cord insertion

## 2012-12-11 ENCOUNTER — Encounter (HOSPITAL_BASED_OUTPATIENT_CLINIC_OR_DEPARTMENT_OTHER): Payer: Self-pay | Admitting: *Deleted

## 2012-12-11 ENCOUNTER — Emergency Department (HOSPITAL_BASED_OUTPATIENT_CLINIC_OR_DEPARTMENT_OTHER)
Admission: EM | Admit: 2012-12-11 | Discharge: 2012-12-11 | Disposition: A | Discharge status: Home / Self Care | Payer: Medicaid Other | Attending: Emergency Medicine | Admitting: Emergency Medicine

## 2012-12-11 DIAGNOSIS — R040 Epistaxis: Secondary | ICD-10-CM | POA: Insufficient documentation

## 2012-12-11 DIAGNOSIS — Z79899 Other long term (current) drug therapy: Secondary | ICD-10-CM | POA: Insufficient documentation

## 2012-12-11 DIAGNOSIS — O9989 Other specified diseases and conditions complicating pregnancy, childbirth and the puerperium: Secondary | ICD-10-CM | POA: Insufficient documentation

## 2012-12-11 DIAGNOSIS — J329 Chronic sinusitis, unspecified: Secondary | ICD-10-CM | POA: Insufficient documentation

## 2012-12-11 DIAGNOSIS — R5381 Other malaise: Secondary | ICD-10-CM | POA: Insufficient documentation

## 2012-12-11 LAB — CBC WITH DIFFERENTIAL/PLATELET
Basophils Relative: 0 % (ref 0–1)
Eosinophils Absolute: 0.2 10*3/uL (ref 0.0–0.7)
MCH: 28.7 pg (ref 26.0–34.0)
MCHC: 32.7 g/dL (ref 30.0–36.0)
Neutro Abs: 5.4 10*3/uL (ref 1.7–7.7)
Neutrophils Relative %: 68 % (ref 43–77)
Platelets: 288 10*3/uL (ref 150–400)
RBC: 4.08 MIL/uL (ref 3.87–5.11)

## 2012-12-11 LAB — COMPREHENSIVE METABOLIC PANEL
ALT: 30 U/L (ref 0–35)
AST: 33 U/L (ref 0–37)
Albumin: 3.7 g/dL (ref 3.5–5.2)
Alkaline Phosphatase: 75 U/L (ref 39–117)
Chloride: 100 mEq/L (ref 96–112)
Potassium: 4 mEq/L (ref 3.5–5.1)
Sodium: 135 mEq/L (ref 135–145)
Total Protein: 8.3 g/dL (ref 6.0–8.3)

## 2012-12-11 LAB — URINALYSIS, ROUTINE W REFLEX MICROSCOPIC
Glucose, UA: NEGATIVE mg/dL
Hgb urine dipstick: NEGATIVE
Ketones, ur: NEGATIVE mg/dL
Protein, ur: NEGATIVE mg/dL

## 2012-12-11 LAB — URINE MICROSCOPIC-ADD ON

## 2012-12-11 MED ORDER — AZITHROMYCIN 250 MG PO TABS: 250.0000 mg | ORAL_TABLET | Freq: Every day | ORAL | Status: DC

## 2012-12-11 NOTE — ED Notes (Signed)
Patient states she has a one month history of sob and sinus congestions.  States yesterday she developed clear sinus drainage.  Pt is [redacted] weeks pregnant.  States today she just does not feel good.

## 2012-12-11 NOTE — ED Provider Notes (Signed)
History     CSN: [id number]  Arrival date & time 12/11/12  1152   First MD Initiated Contact with Patient 12/11/12 1254      Chief Complaint  Patient presents with  . Fatigue    (Consider location/radiation/quality/duration/timing/severity/associated sxs/prior treatment) HPI Comments: Pt states that she is having some congestion and WGN:FAOZ asked if the sob was related to her nose or her mouth and she states that he nose:pt states that she is being seen by [place] ob and this is her first pregnancy:pt was seen and had an ultrasound 2 days ago at the office:pt states that she was sob before the pregnancy and it seems to be worsening as she gets more pregnant:pt states that she is very tired:pt states that he has not had any swelling or cramping:pt states that she has not taking anything for the symptoms:pt states that the symptoms started after she had a nose bleed and she has had congestion since:pt states that that was about 1 month ago  The history is provided by the patient. No language interpreter was used.    Past Medical History  Diagnosis Date  . Infection     UTI  07/15/12  . Infection     07/2012  . Infection     07/2012    Past Surgical History  Procedure Laterality Date  . No past surgeries      Family History  Problem Relation Age of Onset  . Other Father     MVA    History  Substance Use Topics  . Smoking status: Never Smoker   . Smokeless tobacco: Never Used  . Alcohol Use: No    OB History   Grav Para Term Preterm Abortions TAB SAB Ect Mult Living   1               Review of Systems  Constitutional: Negative.   Respiratory: Negative.   Cardiovascular: Negative.     Allergies  Amoxicillin  Home Medications   Current Outpatient Rx  Name  Route  Sig  Dispense  Refill  . ibuprofen (ADVIL,MOTRIN) 200 MG tablet   Oral   Take 400 mg by mouth every 6 (six) hours as needed. For pain          . metroNIDAZOLE (METROGEL VAGINAL)  0.75 % vaginal gel      Pt to insert 1 applicator full per vagina qhs x 5days   70 g   0   . Multiple Vitamin (MULITIVITAMIN WITH MINERALS) TABS   Oral   Take 1 tablet by mouth daily.           Marland Kitchen nystatin-triamcinolone ointment (MYCOLOG)   Topical   Apply topically 2 (two) times daily.   30 g   0   . ondansetron (ZOFRAN) 8 MG tablet   Oral   Take by mouth every 8 (eight) hours as needed.         . Prenatal Vit-Fe Fumarate-FA (PRENATAL MULTIVITAMIN) TABS   Oral   Take 1 tablet by mouth daily.   30 tablet   11   . Prenatal Vit-Fe Fumarate-FA (SE-NATAL 19) 29-1 MG CHEW   Oral   Chew 1 tablet by mouth daily.   30 tablet   12   . promethazine (PHENERGAN) 25 MG tablet   Oral   Take 1 tablet (25 mg total) by mouth every 6 (six) hours as needed for nausea.   36 tablet   1   . terconazole (  TERAZOL 7) 0.4 % vaginal cream      Pt to insert 1 applicator full per vagina qhs x 7days after completion of Metrogel   45 g   0   . triamcinolone (KENALOG) 0.025 % cream   Topical   Apply topically 2 (two) times daily.           BP 139/75  Temp(Src) 98.2 F (36.8 C) (Oral)  SpO2 100%  LMP 06/17/2012  Physical Exam  Nursing note and vitals reviewed. Constitutional: She appears well-developed and well-nourished.  HENT:  Head: Normocephalic.  Right Ear: External ear normal.  Left Ear: External ear normal.  Nose: Mucosal edema present. Right sinus exhibits frontal sinus tenderness. Left sinus exhibits frontal sinus tenderness.  Eyes: Conjunctivae and EOM are normal.  Neck: Normal range of motion. Neck supple.  Cardiovascular: Normal rate and regular rhythm.   Pulmonary/Chest: Effort normal and breath sounds normal. No respiratory distress.  Musculoskeletal: She exhibits no edema.  Neurological: She is alert.  Skin: Skin is warm and dry.    ED Course  Procedures (including critical care time)  Labs Reviewed  URINALYSIS, ROUTINE W REFLEX MICROSCOPIC - Abnormal;  Notable for the following:    APPearance TURBID (*)    Leukocytes, UA LARGE (*)    All other components within normal limits  URINE MICROSCOPIC-ADD ON - Abnormal; Notable for the following:    Squamous Epithelial / LPF FEW (*)    Bacteria, UA FEW (*)    All other components within normal limits  CBC WITH DIFFERENTIAL - Abnormal; Notable for the following:    Hemoglobin 11.7 (*)    HCT 35.8 (*)    RDW 15.8 (*)    All other components within normal limits  COMPREHENSIVE METABOLIC PANEL - Abnormal; Notable for the following:    Total Bilirubin 0.2 (*)    All other components within normal limits  URINE CULTURE   No results found.   1. Sinusitis       MDM  Pt is okay to follow up with ZO:XWRU treat for sinusitis:hgb wnl for pregnancy:pt has no swelling or signs of preeclampsia        Teressa Lower, NP 12/11/12 1407

## 2012-12-11 NOTE — ED Provider Notes (Signed)
Medical screening examination/treatment/procedure(s) were performed by non-physician practitioner and as supervising physician I was immediately available for consultation/collaboration.   Dione Booze, MD 12/11/12 1505

## 2012-12-12 LAB — URINE CULTURE: Colony Count: 9000

## 2013-01-13 ENCOUNTER — Encounter: Payer: Self-pay | Admitting: Obstetrics and Gynecology

## 2013-02-13 ENCOUNTER — Inpatient Hospital Stay (HOSPITAL_COMMUNITY)
Admission: AD | Admit: 2013-02-13 | Discharge: 2013-02-13 | Disposition: A | Discharge status: Home / Self Care | Payer: Medicaid Other | Source: Ambulatory Visit | Attending: Obstetrics and Gynecology | Admitting: Obstetrics and Gynecology

## 2013-02-13 ENCOUNTER — Encounter (HOSPITAL_COMMUNITY): Payer: Self-pay | Admitting: *Deleted

## 2013-02-13 DIAGNOSIS — R030 Elevated blood-pressure reading, without diagnosis of hypertension: Secondary | ICD-10-CM | POA: Insufficient documentation

## 2013-02-13 DIAGNOSIS — O368130 Decreased fetal movements, third trimester, not applicable or unspecified: Secondary | ICD-10-CM

## 2013-02-13 DIAGNOSIS — O368190 Decreased fetal movements, unspecified trimester, not applicable or unspecified: Secondary | ICD-10-CM | POA: Insufficient documentation

## 2013-02-13 DIAGNOSIS — IMO0002 Reserved for concepts with insufficient information to code with codable children: Secondary | ICD-10-CM | POA: Insufficient documentation

## 2013-02-13 DIAGNOSIS — O1203 Gestational edema, third trimester: Secondary | ICD-10-CM

## 2013-02-13 NOTE — MAU Note (Signed)
Patient states her [facility] nurse instructed patient to come to the MAU due to having elevated blood pressure today and having swelling in her feet. Denies contractions, bleeding or leaking and reports normal fetal movement.

## 2013-02-13 NOTE — MAU Provider Note (Signed)
History   Tracey Leonard is a 27y.o. BF at [redacted]w[redacted]d who presents unannounced w/ CC  That her [facility] nurse instructed patient to come to the MAU due to having elevated blood pressure today and having swelling in her feet. Denies contractions, bleeding or leaking and reports normal fetal movement.     Pt doesn't recall exact BP reading.  Pt works on her feet.  Swelling present over the last week or two.  Pt denies PIH s/s does does report occ'l floater.  Also c/o decreased fetal movement.  Denies LOF or Vb.  Does report occ'l abdominal tightening.  No resp or GI c/o's.  She asked if her "boost" shakes could be contributing to LE edema.   .. Patient Active Problem List   Diagnosis Date Noted  . Marginal insertion of umbilical cord - EFW q4wks starting at 24wks 12/02/2012  . Pregnant state, incidental 08/28/2012  . Allergy to amoxicillin 08/28/2012  . First trimester bleeding 08/07/2012  . Vaginal discharge 08/07/2012      CSN: [id number]  Arrival date and time: 02/13/13 1510   None     Chief Complaint  Patient presents with  . Hypertension  . Leg Swelling   HPI  OB History   Grav Para Term Preterm Abortions TAB SAB Ect Mult Living   1               Past Medical History  Diagnosis Date  . Infection     UTI  07/15/12  . Infection     07/2012  . Infection     07/2012    Past Surgical History  Procedure Laterality Date  . No past surgeries      Family History  Problem Relation Age of Onset  . Other Father     MVA    History  Substance Use Topics  . Smoking status: Never Smoker   . Smokeless tobacco: Never Used  . Alcohol Use: No    Allergies:  Allergies  Allergen Reactions  . Amoxicillin Anaphylaxis and Rash    Prescriptions prior to admission  Medication Sig Dispense Refill  . iron polysaccharides (NIFEREX) 150 MG capsule Take 150 mg by mouth 2 (two) times daily.      . Prenatal Vit-Fe Fumarate-FA (SE-NATAL 19) 29-1 MG CHEW Chew 1 tablet by  mouth daily.  30 tablet  12  . triamcinolone (KENALOG) 0.025 % cream Apply 1 application topically daily as needed (For rash.).         ROS--see HPI above Physical Exam   Blood pressure 122/72, pulse 91, temperature 98.2 F (36.8 C), temperature source Oral, resp. rate 16, height 5' 4.5" (1.638 m), weight 182 lb 6.4 oz (82.736 kg), last menstrual period 06/17/2012, SpO2 99.00%. .. Filed Vitals:   02/13/13 1533 02/13/13 1615 02/13/13 1629 02/13/13 1644  BP: 124/77 126/71 126/72 122/72  Pulse: 93 86 90 91  Temp: 98.2 F (36.8 C)     TempSrc: Oral     Resp: 16     Height: 5' 4.5" (1.638 m)     Weight: 182 lb 6.4 oz (82.736 kg)     SpO2: 99%      Physical Exam  Constitutional: She is oriented to person, place, and time. She appears well-developed and well-nourished. No distress.  HENT:  Head: Normocephalic and atraumatic.  Eyes: Pupils are equal, round, and reactive to light.  Cardiovascular: Normal rate.   Respiratory: Effort normal.  GI: Soft.  gravid  Genitourinary:  Pelvic exam  deferred  Musculoskeletal: She exhibits edema.  1+ BLE pitting and pedal edema  Neurological: She is alert and oriented to person, place, and time. She has normal reflexes.  No clonus  Skin: Skin is warm and dry.  Marland Kitchen  MAU Course  Procedures 1. NST--140, reactive, mod variability, questionable decel after a ctx, but reactive after; TOCO: UI  Assessment and Plan  1. [redacted]w[redacted]d 2. Dependent edema of pregnancy 3. Decreased FM w/ Reactive NST 4.  Normotensive  1. D/c'd home w/ PIH precautions, low-Na diet, PTL precautions 2.  Disc'd comfort measures for edema, and rec'd supportive shoes at work and possibly compression hose 3. rev'd FKC 4.  Keep upcoming appt at [facility], or f/u prn 5. Pt requested note to be OOW today, and given 6.  After pt left, further report from off-going CNM was for [facility] labs, but b/c normotensive, d/c'd  Tracey Leonard H 02/13/2013, 5:09 PM

## 2013-03-11 ENCOUNTER — Encounter (HOSPITAL_COMMUNITY): Payer: Self-pay | Admitting: *Deleted

## 2013-03-11 ENCOUNTER — Other Ambulatory Visit: Payer: Self-pay | Admitting: Obstetrics and Gynecology

## 2013-03-11 ENCOUNTER — Inpatient Hospital Stay (HOSPITAL_COMMUNITY)
Admission: AD | Admit: 2013-03-11 | Discharge: 2013-03-11 | Disposition: A | Discharge status: Home / Self Care | Payer: Medicaid Other | Source: Ambulatory Visit | Attending: Obstetrics and Gynecology | Admitting: Obstetrics and Gynecology

## 2013-03-11 DIAGNOSIS — R030 Elevated blood-pressure reading, without diagnosis of hypertension: Secondary | ICD-10-CM | POA: Insufficient documentation

## 2013-03-11 DIAGNOSIS — O133 Gestational [pregnancy-induced] hypertension without significant proteinuria, third trimester: Secondary | ICD-10-CM

## 2013-03-11 DIAGNOSIS — O99891 Other specified diseases and conditions complicating pregnancy: Secondary | ICD-10-CM | POA: Insufficient documentation

## 2013-03-11 LAB — COMPREHENSIVE METABOLIC PANEL
AST: 21 U/L (ref 0–37)
BUN: 5 mg/dL — ABNORMAL LOW (ref 6–23)
CO2: 24 mEq/L (ref 19–32)
Chloride: 102 mEq/L (ref 96–112)
Creatinine, Ser: 0.59 mg/dL (ref 0.50–1.10)
GFR calc non Af Amer: >90 mL/min (ref >90)
Glucose, Bld: 98 mg/dL (ref 70–99)
Total Bilirubin: 0.2 mg/dL — ABNORMAL LOW (ref 0.3–1.2)

## 2013-03-11 LAB — URIC ACID: Uric Acid, Serum: 3.5 mg/dL (ref 2.4–7.0)

## 2013-03-11 LAB — CBC
HCT: 31.6 % — ABNORMAL LOW (ref 36.0–46.0)
Hemoglobin: 10.1 g/dL — ABNORMAL LOW (ref 12.0–15.0)
MCV: 85.9 fL (ref 78.0–100.0)
Platelets: 237 10*3/uL (ref 150–400)
RBC: 3.68 MIL/uL — ABNORMAL LOW (ref 3.87–5.11)
WBC: 6.5 10*3/uL (ref 4.0–10.5)

## 2013-03-11 NOTE — Progress Notes (Signed)
Manfred Arch CNM in to see pt. Lab results and d/c plan discussed. Written and verbal d/c instructions given and understanding voiced.

## 2013-03-11 NOTE — MAU Note (Signed)
Patient was sent from the office for evaluation of elevated blood pressure.

## 2013-03-11 NOTE — Progress Notes (Signed)
Patient states her ride is here and has been waiting for at least 30 mins. Patient states she will need to leave soon even if she does not have D/C papers.

## 2013-03-11 NOTE — MAU Provider Note (Signed)
History   27 yo G1P0 at 53 1/7 weeks presented from office for further evaluation of BP--values at office were 130/90 and 140/100.  Denies HA, visual sx, or epigastric pain.  Does report pedal edema, noted since approx 30 weeks, still present.  Reports +FM.  Trace protein on voided specimen at office.  Had evaluation for swelling and elevated BP at home on 02/14/13--normotensive in MAU.  No labs done.  Patient Active Problem List   Diagnosis Date Noted  . Marginal insertion of umbilical cord - EFW q4wks starting at 24wks 12/02/2012  . Pregnant state, incidental 08/28/2012  . Allergy to amoxicillin 08/28/2012  . First trimester bleeding 08/07/2012  . Vaginal discharge 08/07/2012     Chief Complaint  Patient presents with  . Hypertension     OB History   Grav Para Term Preterm Abortions TAB SAB Ect Mult Living   1         0      Past Medical History  Diagnosis Date  . Infection     UTI  07/15/12  . Infection     07/2012  . Infection     07/2012    Past Surgical History  Procedure Laterality Date  . No past surgeries      Family History  Problem Relation Age of Onset  . Other Father     MVA    History  Substance Use Topics  . Smoking status: Never Smoker   . Smokeless tobacco: Never Used  . Alcohol Use: No    Allergies:  Allergies  Allergen Reactions  . Amoxicillin Itching and Rash    Prescriptions prior to admission  Medication Sig Dispense Refill  . iron polysaccharides (NIFEREX) 150 MG capsule Take 150 mg by mouth 2 (two) times daily.      . Prenatal Vit-Fe Fumarate-FA (SE-NATAL 19) 29-1 MG CHEW Chew 1 tablet by mouth daily.  30 tablet  12  . triamcinolone (KENALOG) 0.025 % cream Apply 1 application topically daily as needed (For rash.).         Physical Exam   Blood pressure 136/81, pulse 95, temperature 98.1 F (36.7 C), temperature source Oral, resp. rate 18, height 5\' 4"  (1.626 m), weight 190 lb 9.6 oz (86.456 kg), last menstrual period  06/17/2012.  Filed Vitals:   03/11/13 1102 03/11/13 1112 03/11/13 1122 03/11/13 1125  BP: 139/85 137/88 137/93 136/81  Pulse: 94 84 97 95  Temp:      TempSrc:      Resp:      Height:      Weight:       Chest clear Heart RRR without murmur Abd gravid, NT Pelvic--deferred  Ext DTR 1+, no clonus, 1+ edema.  FHR Category 1 UCs irregular, mild.  ED Course  IUP at 38 1/7 weeks Mildly elevated BP  Plan: PIH labs Protein/creatnine clearance   Nigel Bridgeman CNM, [place] 03/11/2013 11:29 AM  Addendum:  Ceasar Mons Vitals:   03/11/13 1112 03/11/13 1122 03/11/13 1125 03/11/13 1403  BP: 137/88 137/93 136/81 124/78  Pulse: 84 97 95 86  Temp:      TempSrc:      Resp:    18  Height:      Weight:       Results for orders placed during the hospital encounter of 03/11/13 (from the past 24 hour(s))  PROTEIN / CREATININE RATIO, URINE     Status: None   Collection Time    03/11/13 10:20 AM  Result Value Range   Creatinine, Urine 132.47     Total Protein, Urine 17     PROTEIN CREATININE RATIO 0.13  0.00 - 0.15  CBC     Status: Abnormal   Collection Time    03/11/13 10:35 AM      Result Value Range   WBC 6.5  4.0 - 10.5 K/uL   RBC 3.68 (*) 3.87 - 5.11 MIL/uL   Hemoglobin 10.1 (*) 12.0 - 15.0 g/dL   HCT 16.1 (*) 09.6 - 04.5 %   MCV 85.9  78.0 - 100.0 fL   MCH 27.4  26.0 - 34.0 pg   MCHC 32.0  30.0 - 36.0 g/dL   RDW 40.9  81.1 - 91.4 %   Platelets 237  150 - 400 K/uL  COMPREHENSIVE METABOLIC PANEL     Status: Abnormal   Collection Time    03/11/13 10:35 AM      Result Value Range   Sodium 135  135 - 145 mEq/L   Potassium 3.3 (*) 3.5 - 5.1 mEq/L   Chloride 102  96 - 112 mEq/L   CO2 24  19 - 32 mEq/L   Glucose, Bld 98  70 - 99 mg/dL   BUN 5 (*) 6 - 23 mg/dL   Creatinine, Ser 7.82  0.50 - 1.10 mg/dL   Calcium 8.7  8.4 - 95.6 mg/dL   Total Protein 6.1  6.0 - 8.3 g/dL   Albumin 2.6 (*) 3.5 - 5.2 g/dL   AST 21  0 - 37 U/L   ALT 14  0 - 35 U/L   Alkaline Phosphatase 125 (*)  39 - 117 U/L   Total Bilirubin 0.2 (*) 0.3 - 1.2 mg/dL   GFR calc non Af Amer >90  >90 mL/min   GFR calc Af Amer >90  >90 mL/min  LACTATE DEHYDROGENASE     Status: None   Collection Time    03/11/13 10:35 AM      Result Value Range   LDH 183  94 - 250 U/L  URIC ACID     Status: None   Collection Time    03/11/13 10:35 AM      Result Value Range   Uric Acid, Serum 3.5  2.4 - 7.0 mg/dL   Consulted with Dr. Stefano Gaul. D/C'd home  PIH instructions reviewed. BP check in office on Friday--office will call to schedule. Will be out of work now to allow for increased rest. Keep scheduled appt or call prn.  Nigel Bridgeman, CNM 03/11/13 2p

## 2013-03-20 ENCOUNTER — Telehealth (HOSPITAL_COMMUNITY): Payer: Self-pay | Admitting: *Deleted

## 2013-03-20 ENCOUNTER — Encounter (HOSPITAL_COMMUNITY): Payer: Self-pay | Admitting: *Deleted

## 2013-03-20 NOTE — Telephone Encounter (Signed)
Preadmission screen  

## 2013-03-22 ENCOUNTER — Inpatient Hospital Stay (HOSPITAL_COMMUNITY)
Admission: AD | Admit: 2013-03-22 | Discharge: 2013-03-24 | DRG: 775 | Disposition: A | Discharge status: Home / Self Care | Payer: Medicaid Other | Source: Ambulatory Visit | Attending: Obstetrics and Gynecology | Admitting: Obstetrics and Gynecology

## 2013-03-22 ENCOUNTER — Encounter (HOSPITAL_COMMUNITY): Payer: Self-pay | Admitting: Anesthesiology

## 2013-03-22 ENCOUNTER — Inpatient Hospital Stay (HOSPITAL_COMMUNITY): Payer: Medicaid Other | Admitting: Anesthesiology

## 2013-03-22 ENCOUNTER — Encounter (HOSPITAL_COMMUNITY): Payer: Self-pay | Admitting: *Deleted

## 2013-03-22 DIAGNOSIS — O411090 Infection of amniotic sac and membranes, unspecified, unspecified trimester, not applicable or unspecified: Secondary | ICD-10-CM | POA: Diagnosis present

## 2013-03-22 DIAGNOSIS — O139 Gestational [pregnancy-induced] hypertension without significant proteinuria, unspecified trimester: Secondary | ICD-10-CM | POA: Diagnosis present

## 2013-03-22 DIAGNOSIS — O4100X0 Oligohydramnios, unspecified trimester, not applicable or unspecified: Principal | ICD-10-CM | POA: Diagnosis present

## 2013-03-22 DIAGNOSIS — IMO0001 Reserved for inherently not codable concepts without codable children: Secondary | ICD-10-CM | POA: Insufficient documentation

## 2013-03-22 LAB — URINALYSIS, ROUTINE W REFLEX MICROSCOPIC
Bilirubin Urine: NEGATIVE
Specific Gravity, Urine: 1.02 (ref 1.005–1.030)
Urobilinogen, UA: 1 mg/dL (ref 0.0–1.0)

## 2013-03-22 LAB — CBC
HCT: 34 % — ABNORMAL LOW (ref 36.0–46.0)
MCHC: 32.6 g/dL (ref 30.0–36.0)
MCV: 84.8 fL (ref 78.0–100.0)
RDW: 14.9 % (ref 11.5–15.5)

## 2013-03-22 LAB — PROTEIN / CREATININE RATIO, URINE: Total Protein, Urine: 14 mg/dL

## 2013-03-22 LAB — URINE MICROSCOPIC-ADD ON

## 2013-03-22 MED ORDER — LACTATED RINGERS IV SOLN
INTRAVENOUS | Status: DC
  Administered 2013-03-22: 16:00:00 via INTRAVENOUS

## 2013-03-22 MED ORDER — TERBUTALINE SULFATE 1 MG/ML IJ SOLN: 0.2500 mg | Freq: Once | INTRAMUSCULAR | Status: AC | PRN

## 2013-03-22 MED ORDER — FENTANYL 2.5 MCG/ML BUPIVACAINE 1/10 % EPIDURAL INFUSION (WH - ANES)
INTRAMUSCULAR | Status: DC | PRN
  Administered 2013-03-22: 14 mL/h via EPIDURAL

## 2013-03-22 MED ORDER — BUTORPHANOL TARTRATE 1 MG/ML IJ SOLN
INTRAMUSCULAR | Status: AC
  Filled 2013-03-22: qty 1

## 2013-03-22 MED ORDER — IBUPROFEN 600 MG PO TABS
600.0000 mg | ORAL_TABLET | Freq: Four times a day (QID) | ORAL | Status: DC | PRN
  Administered 2013-03-23: 600 mg via ORAL
  Filled 2013-03-22: qty 1

## 2013-03-22 MED ORDER — OXYTOCIN BOLUS FROM INFUSION
500.0000 mL | Freq: Once | INTRAVENOUS | Status: AC | PRN
  Administered 2013-03-22: 500 mL via INTRAVENOUS

## 2013-03-22 MED ORDER — LACTATED RINGERS IV SOLN
500.0000 mL | Freq: Once | INTRAVENOUS | Status: AC
  Administered 2013-03-22: 500 mL via INTRAVENOUS

## 2013-03-22 MED ORDER — PHENYLEPHRINE 40 MCG/ML (10ML) SYRINGE FOR IV PUSH (FOR BLOOD PRESSURE SUPPORT)
80.0000 ug | PREFILLED_SYRINGE | INTRAVENOUS | Status: DC | PRN
  Filled 2013-03-22: qty 5
  Filled 2013-03-22: qty 2

## 2013-03-22 MED ORDER — LIDOCAINE HCL (PF) 1 % IJ SOLN
30.0000 mL | INTRAMUSCULAR | Status: DC | PRN
  Administered 2013-03-22: 30 mL via SUBCUTANEOUS
  Filled 2013-03-22 (×2): qty 30

## 2013-03-22 MED ORDER — LACTATED RINGERS IV SOLN
INTRAVENOUS | Status: DC
  Administered 2013-03-22: 20:00:00 via INTRAVENOUS

## 2013-03-22 MED ORDER — OXYTOCIN 40 UNITS IN LACTATED RINGERS INFUSION - SIMPLE MED: 62.5000 mL/h | Freq: Once | INTRAVENOUS | Status: AC | PRN

## 2013-03-22 MED ORDER — EPHEDRINE 5 MG/ML INJ
10.0000 mg | INTRAVENOUS | Status: DC | PRN
  Filled 2013-03-22: qty 2

## 2013-03-22 MED ORDER — FENTANYL 2.5 MCG/ML BUPIVACAINE 1/10 % EPIDURAL INFUSION (WH - ANES)
14.0000 mL/h | INTRAMUSCULAR | Status: DC | PRN
  Filled 2013-03-22: qty 125

## 2013-03-22 MED ORDER — LABETALOL HCL 5 MG/ML IV SOLN: 10.0000 mg | Freq: Once | INTRAVENOUS | Status: DC

## 2013-03-22 MED ORDER — OXYCODONE-ACETAMINOPHEN 5-325 MG PO TABS: 1.0000 | ORAL_TABLET | ORAL | Status: DC | PRN

## 2013-03-22 MED ORDER — LABETALOL HCL 5 MG/ML IV SOLN: 10.0000 mg | INTRAVENOUS | Status: DC | PRN

## 2013-03-22 MED ORDER — DIPHENHYDRAMINE HCL 50 MG/ML IJ SOLN
12.5000 mg | INTRAMUSCULAR | Status: DC | PRN
Start: 2013-03-22 — End: 2013-03-23

## 2013-03-22 MED ORDER — OXYTOCIN 40 UNITS IN LACTATED RINGERS INFUSION - SIMPLE MED
1.0000 m[IU]/min | INTRAVENOUS | Status: DC
  Administered 2013-03-22: 1 m[IU]/min via INTRAVENOUS
  Filled 2013-03-22: qty 1000

## 2013-03-22 MED ORDER — LACTATED RINGERS IV SOLN
500.0000 mL | INTRAVENOUS | Status: DC | PRN
  Administered 2013-03-22: 1000 mL via INTRAVENOUS

## 2013-03-22 MED ORDER — ONDANSETRON HCL 4 MG/2ML IJ SOLN: 4.0000 mg | Freq: Four times a day (QID) | INTRAMUSCULAR | Status: DC | PRN

## 2013-03-22 MED ORDER — BUTORPHANOL TARTRATE 1 MG/ML IJ SOLN
1.0000 mg | INTRAMUSCULAR | Status: DC | PRN
  Administered 2013-03-22: 1 mg via INTRAVENOUS

## 2013-03-22 MED ORDER — LIDOCAINE HCL (PF) 1 % IJ SOLN
INTRAMUSCULAR | Status: DC | PRN
  Administered 2013-03-22: 3 mL
  Administered 2013-03-22 (×2): 5 mL

## 2013-03-22 MED ORDER — EPHEDRINE 5 MG/ML INJ
10.0000 mg | INTRAVENOUS | Status: DC | PRN
  Filled 2013-03-22: qty 4
  Filled 2013-03-22: qty 2

## 2013-03-22 MED ORDER — PHENYLEPHRINE 40 MCG/ML (10ML) SYRINGE FOR IV PUSH (FOR BLOOD PRESSURE SUPPORT)
80.0000 ug | PREFILLED_SYRINGE | INTRAVENOUS | Status: DC | PRN
  Filled 2013-03-22: qty 2

## 2013-03-22 MED ORDER — ACETAMINOPHEN 325 MG PO TABS: 650.0000 mg | ORAL_TABLET | ORAL | Status: DC | PRN

## 2013-03-22 MED ORDER — CITRIC ACID-SODIUM CITRATE 334-500 MG/5ML PO SOLN: 30.0000 mL | ORAL | Status: DC | PRN

## 2013-03-22 NOTE — H&P (Signed)
Tracey Leonard is a 27 y.o. female, G1P0 at 16 5/7 weeks, presenting for admission from MAU in early labor, occasional mild variables, hx low fluid on [place] this week.  Scheduled for induction tomorrow at 7pm due to gestational hypertension and low fluid.  History of present pregnancy: Patient entered care at 7 2/7 weeks.   EDC of 03/24/13 was established by LMP and [place] at 6 5/7 weeks   Anatomy scan:  18 weeks, with normal findings and an marginal insertion of the cord, posterior placenta.   Additional [place] evaluations:   25 weeks--normal findings and growth 26 2/7 weeks--1+13, 15%ile, normal fluid, marginal insertion reviewed 27 3/7 weeks--2+4, 24%ile, AFI WNL.   33 weeks--4+7, 26%ile, AFI WNL 39 weeks--BPP 8/8, vtx, AFI 7.14, 10%ile (done due to variable decels) Significant prenatal events:  Seen at [facility] ER for UTI, yeast, and BV at approx 26 weeks, US WNL per patient report.  Patient elected not to treat any of those issues. BPs have been 130s/80-90s last several weeks.   Last evaluation:  03/20/13--1 cm, 50%, vtx, -3.  Elevated BP and edema at 34 weeks.    History OB History   Grav Para Term Preterm Abortions TAB SAB Ect Mult Living   1         0     Past Medical History  Diagnosis Date  . Infection     UTI  07/15/12  . Infection     07/2012  . Infection     07/2012  . Hypertension   . Pregnancy induced hypertension    Past Surgical History  Procedure Laterality Date  . No past surgeries     Family History: family history includes Other in her father. Social History:  reports that she has never smoked. She has never used smokeless tobacco. She reports that she does not drink alcohol or use illicit drugs.   Prenatal Transfer Tool  Maternal Diabetes: No Genetic Screening: Normal Maternal Ultrasounds/Referrals: Abnormal:  Findings:   Other:AFI 10%ile on 03/20/13 Fetal Ultrasounds or other Referrals:  None Maternal Substance Abuse:  No Significant Maternal Medications:   None Significant Maternal Lab Results:  Lab values include: Group B Strep negative Other Comments:  Hx gestational hyperention--no meds.  ROS;   Mild cramping, spotting, +FM.  Dilation: 2 Effacement (%): 80 Station: -2 Exam by:: Junita Push Blood pressure 148/90, pulse 89, temperature 98 F (36.7 C), temperature source Oral, resp. rate 18, height 5\' 4"  (1.626 m), weight 190 lb 6.4 oz (86.365 kg), last menstrual period 06/17/2012. Exam Physical Exam  Chest clear Heart RRR without murmur Abd gravid, NT Pelvic--cervix 2 cm, 80%, vtx, -1, membranes noted. Ext DTR 2+ without clonus  FHR initially tracing had decreased variability, with sporadic mild variables. Now more reactive. UCs q 3-4 min, mild/moderate.  Prenatal labs: ABO, Rh: A/POS/-- (11/18 4098) Antibody: NEG (11/18 0905) Rubella: 30.5 (11/18 0905) RPR: NON REAC (11/18 0905)  HBsAg: NEGATIVE (11/18 0905)  HIV: NON REACTIVE (11/18 0905)  GBS: Negative (07/03 0000)  Hgb 11.2 at NOB, 11.7 at 28 week visit Platelets 237 at 28 weeks. Hgb electrophoresis   Assessment/Plan: IUP at 39 5/7 weeks Early labor Oligohydramnios GBS negative. Gestational hypertension  Plan; Admit to [facility] Suite per consult with Dr. Richardson Dopp Routine [facility] orders Epidural prn Monitor BP Plan pitocin augmentation per low dose protocol. Pain medication prn--patient planning epidural.    LATHAM, VICKI 03/22/2013, 4:30 PM

## 2013-03-22 NOTE — MAU Note (Signed)
Patient presents to MAU with c/o leaking watery pink vaginal discharge since 0700 today. Reports +FM.

## 2013-03-22 NOTE — MAU Note (Signed)
Pt reprots having some mild cramping. Noticing some Blood  Leaking out. Good fetal movement reported.

## 2013-03-22 NOTE — Progress Notes (Signed)
  Subjective: In chair at bedside--talking on phone.  Denies HA, visual sx, or epigastric pain.  Objective: BP 141/86  Pulse 79  Temp(Src) 98 F (36.7 C) (Oral)  Resp 18  Ht 5\' 4"  (1.626 m)  Wt 190 lb 6.4 oz (86.365 kg)  BMI 32.67 kg/m2  LMP 06/17/2012      Filed Vitals:   03/22/13 1419 03/22/13 1542 03/22/13 1632 03/22/13 1659  BP: 134/86 148/90 146/93 141/86  Pulse: 101 89 75 79  Temp:  98 F (36.7 C)    TempSrc:  Oral    Resp:  18 18 18   Height:      Weight:         FHT: Category 1 UC:   regular, every 3 minutes SVE:   Dilation: 2 Effacement (%): 80 Station: -2 Exam by:: Ferne Coe RN piror to pitocin initiation. Pitocin currently on 3 mu/min  Results for orders placed during the hospital encounter of 03/22/13 (from the past 24 hour(s))  URINALYSIS, ROUTINE W REFLEX MICROSCOPIC     Status: Abnormal   Collection Time    03/22/13  2:10 PM      Result Value Range   Color, Urine YELLOW  YELLOW   APPearance TURBID (*) CLEAR   Specific Gravity, Urine 1.020  1.005 - 1.030   pH 6.5  5.0 - 8.0   Glucose, UA NEGATIVE  NEGATIVE mg/dL   Hgb urine dipstick LARGE (*) NEGATIVE   Bilirubin Urine NEGATIVE  NEGATIVE   Ketones, ur NEGATIVE  NEGATIVE mg/dL   Protein, ur 30 (*) NEGATIVE mg/dL   Urobilinogen, UA 1.0  0.0 - 1.0 mg/dL   Nitrite NEGATIVE  NEGATIVE   Leukocytes, UA LARGE (*) NEGATIVE  URINE MICROSCOPIC-ADD ON     Status: Abnormal   Collection Time    03/22/13  2:10 PM      Result Value Range   Squamous Epithelial / LPF MANY (*) RARE   WBC, UA TOO NUMEROUS TO COUNT  <3 WBC/hpf   RBC / HPF 7-10  <3 RBC/hpf   Bacteria, UA MANY (*) RARE     Assessment / Plan: IUP at 39 5/7 weeks Early labor Gestational hypertension Low fluid (AFI 10%ile) GBS negative  Plan: Will continue pitocin augmentation. Plan epidural--anticipate improvement in BP then. Labetalol IV prn BP elevations  Arie Gable 03/22/2013, 5:40 PM

## 2013-03-22 NOTE — Anesthesia Procedure Notes (Signed)
Epidural Patient location during procedure: OB  Staffing Anesthesiologist: Elaynah Virginia Performed by: anesthesiologist   Preanesthetic Checklist Completed: patient identified, site marked, surgical consent, pre-op evaluation, timeout performed, IV checked, risks and benefits discussed and monitors and equipment checked  Epidural Patient position: sitting Prep: ChloraPrep Patient monitoring: heart rate, continuous pulse ox and blood pressure Approach: midline Injection technique: LOR saline  Needle:  Needle type: Tuohy  Needle gauge: 17 G Needle length: 9 cm and 9 Needle insertion depth: 6 cm Catheter type: closed end flexible Catheter size: 20 Guage Catheter at skin depth: 12 cm Test dose: negative  Assessment Events: blood not aspirated, injection not painful, no injection resistance, negative IV test and no paresthesia  Additional Notes   Patient tolerated the insertion well without complications.   

## 2013-03-22 NOTE — Progress Notes (Addendum)
Subjective: Comfortable with epidural, but feeling some pressure in lower abdomen.  Patient's family not here yet--she reports mother is coming from [place] and FOB traveling from [place].  Objective: BP 147/85  Pulse 94  Temp(Src) 97.8 F (36.6 C) (Oral)  Resp 20  Ht 5\' 4"  (1.626 m)  Wt 190 lb 6.4 oz (86.365 kg)  BMI 32.67 kg/m2  SpO2 100%  LMP 06/17/2012      Filed Vitals:   03/22/13 1950 03/22/13 2000 03/22/13 2030 03/22/13 2101  BP: 133/86 146/85 131/72 147/85  Pulse: 82 80 77 94  Temp:    97.8 F (36.6 C)  TempSrc:    Oral  Resp: 18  20 20   Height:      Weight:      SpO2:       Results for orders placed during the hospital encounter of 03/22/13 (from the past 24 hour(s))  URINALYSIS, ROUTINE W REFLEX MICROSCOPIC     Status: Abnormal   Collection Time    03/22/13  2:10 PM      Result Value Range   Color, Urine YELLOW  YELLOW   APPearance TURBID (*) CLEAR   Specific Gravity, Urine 1.020  1.005 - 1.030   pH 6.5  5.0 - 8.0   Glucose, UA NEGATIVE  NEGATIVE mg/dL   Hgb urine dipstick LARGE (*) NEGATIVE   Bilirubin Urine NEGATIVE  NEGATIVE   Ketones, ur NEGATIVE  NEGATIVE mg/dL   Protein, ur 30 (*) NEGATIVE mg/dL   Urobilinogen, UA 1.0  0.0 - 1.0 mg/dL   Nitrite NEGATIVE  NEGATIVE   Leukocytes, UA LARGE (*) NEGATIVE  URINE MICROSCOPIC-ADD ON     Status: Abnormal   Collection Time    03/22/13  2:10 PM      Result Value Range   Squamous Epithelial / LPF MANY (*) RARE   WBC, UA TOO NUMEROUS TO COUNT  <3 WBC/hpf   RBC / HPF 7-10  <3 RBC/hpf   Bacteria, UA MANY (*) RARE  CBC     Status: Abnormal   Collection Time    03/22/13  3:45 PM      Result Value Range   WBC 8.1  4.0 - 10.5 K/uL   RBC 4.01  3.87 - 5.11 MIL/uL   Hemoglobin 11.1 (*) 12.0 - 15.0 g/dL   HCT 45.4 (*) 09.8 - 11.9 %   MCV 84.8  78.0 - 100.0 fL   MCH 27.7  26.0 - 34.0 pg   MCHC 32.6  30.0 - 36.0 g/dL   RDW 14.7  82.9 - 56.2 %   Platelets 256  150 - 400 K/uL  PROTEIN / CREATININE RATIO,  URINE     Status: None   Collection Time    03/22/13  9:20 PM      Result Value Range   Creatinine, Urine 102.70     Total Protein, Urine 14     PROTEIN CREATININE RATIO 0.14  0.00 - 0.15   CMP, LDH, and uric acid were not drawn.  Will do with repeat CBC prior to epidural removal.  FHT:  Category 2, sporadic mild variables with UCs UC:   regular, every 2 minutes MVUs 260 Pitocin on 3 mu/min SVE:   Dilation: Lip/rim Effacement (%): 70 Station: 0 Exam by:: Emilee Hero CNM SROM with exam--clear fluid with bloody show. Rim is on right side  Assessment / Plan: Progressive labor Will decrease pitocin to 1 mu/min due to frequency of UCs and mild variables. Await urge to  push.  Tracey Leonard 03/22/2013, 9:06 PM

## 2013-03-22 NOTE — Anesthesia Preprocedure Evaluation (Signed)
Anesthesia Evaluation  Patient identified by MRN, date of birth, ID band Patient awake    Reviewed: Allergy & Precautions, H&P , NPO status , Patient's Chart, lab work & pertinent test results  History of Anesthesia Complications Negative for: history of anesthetic complications  Airway Mallampati: II TM Distance: >3 FB Neck ROM: full    Dental no notable dental hx. (+) Teeth Intact   Pulmonary neg pulmonary ROS,  breath sounds clear to auscultation  Pulmonary exam normal       Cardiovascular hypertension, negative cardio ROS  Rhythm:regular Rate:Normal     Neuro/Psych negative neurological ROS  negative psych ROS   GI/Hepatic negative GI ROS, Neg liver ROS,   Endo/Other  negative endocrine ROS  Renal/GU negative Renal ROS  negative genitourinary   Musculoskeletal   Abdominal Normal abdominal exam  (+)   Peds  Hematology negative hematology ROS (+)   Anesthesia Other Findings   Reproductive/Obstetrics (+) Pregnancy                           Anesthesia Physical Anesthesia Plan  ASA: II  Anesthesia Plan: Epidural   Post-op Pain Management:    Induction:   Airway Management Planned:   Additional Equipment:   Intra-op Plan:   Post-operative Plan:   Informed Consent: I have reviewed the patients History and Physical, chart, labs and discussed the procedure including the risks, benefits and alternatives for the proposed anesthesia with the patient or authorized representative who has indicated his/her understanding and acceptance.     Plan Discussed with:   Anesthesia Plan Comments:         Anesthesia Quick Evaluation  

## 2013-03-23 ENCOUNTER — Inpatient Hospital Stay (HOSPITAL_COMMUNITY): Admission: RE | Admit: 2013-03-23 | Payer: Medicaid Other | Source: Ambulatory Visit

## 2013-03-23 ENCOUNTER — Encounter (HOSPITAL_COMMUNITY): Payer: Self-pay | Admitting: Obstetrics

## 2013-03-23 LAB — URIC ACID: Uric Acid, Serum: 3.9 mg/dL (ref 2.4–7.0)

## 2013-03-23 LAB — COMPREHENSIVE METABOLIC PANEL
AST: 21 U/L (ref 0–37)
Albumin: 2.7 g/dL — ABNORMAL LOW (ref 3.5–5.2)
Calcium: 9.1 mg/dL (ref 8.4–10.5)
Creatinine, Ser: 0.56 mg/dL (ref 0.50–1.10)
GFR calc non Af Amer: >90 mL/min (ref >90)
Total Protein: 6.3 g/dL (ref 6.0–8.3)

## 2013-03-23 LAB — CBC WITH DIFFERENTIAL/PLATELET
Basophils Absolute: 0 10*3/uL (ref 0.0–0.1)
Eosinophils Relative: 0 % (ref 0–5)
HCT: 29.2 % — ABNORMAL LOW (ref 36.0–46.0)
Lymphocytes Relative: 9 % — ABNORMAL LOW (ref 12–46)
Lymphs Abs: 1.5 10*3/uL (ref 0.7–4.0)
MCV: 84.1 fL (ref 78.0–100.0)
Monocytes Absolute: 1.3 10*3/uL — ABNORMAL HIGH (ref 0.1–1.0)
Monocytes Relative: 8 % (ref 3–12)
RDW: 15 % (ref 11.5–15.5)
WBC: 15.9 10*3/uL — ABNORMAL HIGH (ref 4.0–10.5)

## 2013-03-23 LAB — CBC
MCH: 27.8 pg (ref 26.0–34.0)
MCHC: 32.8 g/dL (ref 30.0–36.0)
MCV: 84.8 fL (ref 78.0–100.0)
Platelets: 220 10*3/uL (ref 150–400)
RDW: 14.9 % (ref 11.5–15.5)

## 2013-03-23 LAB — URINE CULTURE

## 2013-03-23 MED ORDER — SENNOSIDES-DOCUSATE SODIUM 8.6-50 MG PO TABS
2.0000 | ORAL_TABLET | Freq: Every day | ORAL | Status: DC
  Administered 2013-03-23: 2 via ORAL

## 2013-03-23 MED ORDER — TETANUS-DIPHTH-ACELL PERTUSSIS 5-2.5-18.5 LF-MCG/0.5 IM SUSP
0.5000 mL | Freq: Once | INTRAMUSCULAR | Status: AC
  Administered 2013-03-24: 0.5 mL via INTRAMUSCULAR
  Filled 2013-03-23: qty 0.5

## 2013-03-23 MED ORDER — SIMETHICONE 80 MG PO CHEW: 80.0000 mg | CHEWABLE_TABLET | ORAL | Status: DC | PRN

## 2013-03-23 MED ORDER — DIBUCAINE 1 % RE OINT: 1.0000 "application " | TOPICAL_OINTMENT | RECTAL | Status: DC | PRN

## 2013-03-23 MED ORDER — LANOLIN HYDROUS EX OINT: TOPICAL_OINTMENT | CUTANEOUS | Status: DC | PRN

## 2013-03-23 MED ORDER — IBUPROFEN 600 MG PO TABS
600.0000 mg | ORAL_TABLET | Freq: Four times a day (QID) | ORAL | Status: DC
  Administered 2013-03-23 – 2013-03-24 (×5): 600 mg via ORAL
  Filled 2013-03-23 (×6): qty 1

## 2013-03-23 MED ORDER — OXYCODONE-ACETAMINOPHEN 5-325 MG PO TABS
1.0000 | ORAL_TABLET | ORAL | Status: DC | PRN
  Administered 2013-03-23: 1 via ORAL
  Filled 2013-03-23: qty 1

## 2013-03-23 MED ORDER — ONDANSETRON HCL 4 MG/2ML IJ SOLN: 4.0000 mg | INTRAMUSCULAR | Status: DC | PRN

## 2013-03-23 MED ORDER — POLYSACCHARIDE IRON COMPLEX 150 MG PO CAPS
150.0000 mg | ORAL_CAPSULE | Freq: Two times a day (BID) | ORAL | Status: DC
  Administered 2013-03-23 – 2013-03-24 (×3): 150 mg via ORAL
  Filled 2013-03-23 (×5): qty 1

## 2013-03-23 MED ORDER — PRENATAL MULTIVITAMIN CH
1.0000 | ORAL_TABLET | Freq: Every day | ORAL | Status: DC
  Administered 2013-03-23 – 2013-03-24 (×2): 1 via ORAL
  Filled 2013-03-23 (×2): qty 1

## 2013-03-23 MED ORDER — ZOLPIDEM TARTRATE 5 MG PO TABS: 5.0000 mg | ORAL_TABLET | Freq: Every evening | ORAL | Status: DC | PRN

## 2013-03-23 MED ORDER — ONDANSETRON HCL 4 MG PO TABS: 4.0000 mg | ORAL_TABLET | ORAL | Status: DC | PRN

## 2013-03-23 MED ORDER — ACETAMINOPHEN 500 MG PO TABS: 1000.0000 mg | ORAL_TABLET | Freq: Four times a day (QID) | ORAL | Status: DC | PRN

## 2013-03-23 MED ORDER — WITCH HAZEL-GLYCERIN EX PADS: 1.0000 "application " | MEDICATED_PAD | CUTANEOUS | Status: DC | PRN

## 2013-03-23 MED ORDER — DIPHENHYDRAMINE HCL 25 MG PO CAPS: 25.0000 mg | ORAL_CAPSULE | Freq: Four times a day (QID) | ORAL | Status: DC | PRN

## 2013-03-23 MED ORDER — BENZOCAINE-MENTHOL 20-0.5 % EX AERO: 1.0000 "application " | INHALATION_SPRAY | CUTANEOUS | Status: DC | PRN

## 2013-03-23 NOTE — Progress Notes (Signed)
Mother consented for baby to receive Hept. B vaccine to be given.  Vaccine given.

## 2013-03-23 NOTE — Progress Notes (Signed)
Post Partum Day 1 Subjective: up ad lib, voiding, tolerating PO, + flatus and undecided on breastfeeding.  c/o HA (temporal) but no other PIH s/s.  VB lighter today.  No dizziness when up.  Considering Nexplanon; has used Depo in past.  Objective: Blood pressure 119/79, pulse 110, temperature 98.4 F (36.9 C), temperature source Oral, resp. rate 18, height 5\' 4"  (1.626 m), weight 190 lb 6.4 oz (86.365 kg), last menstrual period 06/17/2012, SpO2 99.00%, unknown if currently breastfeeding.  Physical Exam:  General: alert, cooperative, appears stated age and no distress; pt holding newborn during my time in room; she was alone otherwise. Lochia: appropriate, rubra Uterine Fundus: firm; below umbilicus Incision: n/a DVT Evaluation: No evidence of DVT seen on physical exam. Negative Homan's sign.   Recent Labs  03/23/13 0029 03/23/13 0550  HGB 10.8* 9.7*  HCT 32.9* 29.2*    Assessment/Plan: Plan for discharge tomorrow and Social Work consult h/o anemia; restarted iron po.  Needs new Rx at d/c tomorrow.  Ordered Tylenol po prn.   LOS: 1 day   Keshawn Fiorito H 03/23/2013, 10:09 AM

## 2013-03-24 LAB — CBC WITH DIFFERENTIAL/PLATELET
HCT: 30 % — ABNORMAL LOW (ref 36.0–46.0)
Hemoglobin: 9.7 g/dL — ABNORMAL LOW (ref 12.0–15.0)
Lymphocytes Relative: 16 % (ref 12–46)
Lymphs Abs: 1.9 10*3/uL (ref 0.7–4.0)
Monocytes Relative: 6 % (ref 3–12)
Neutro Abs: 9.3 10*3/uL — ABNORMAL HIGH (ref 1.7–7.7)
Neutrophils Relative %: 77 % (ref 43–77)
RBC: 3.51 MIL/uL — ABNORMAL LOW (ref 3.87–5.11)

## 2013-03-24 MED ORDER — POLYSACCHARIDE IRON COMPLEX 150 MG PO CAPS: 150.0000 mg | ORAL_CAPSULE | Freq: Every day | ORAL | Status: DC

## 2013-03-24 MED ORDER — IBUPROFEN 600 MG PO TABS: 600.0000 mg | ORAL_TABLET | Freq: Four times a day (QID) | ORAL | Status: DC

## 2013-03-24 NOTE — Progress Notes (Signed)
Pt  Is discharged in the care of friend, Downstairs per ambulatory. Stable. Deniesany pain or discomfort. Denies any pain or discomfort. Discharge instructions with instructions were given to pt. Questions were asked and answered. Stable.

## 2013-03-24 NOTE — Anesthesia Postprocedure Evaluation (Signed)
  Anesthesia Post-op Note  Patient: Tracey Leonard  Procedure(s) Performed: * No procedures listed *  Patient Location: [facility]  Anesthesia Type:Epidural  Level of Consciousness: awake  Airway and Oxygen Therapy: Patient Spontanous Breathing  Post-op Pain: none  Post-op Assessment: Patient's Cardiovascular Status Stable, Respiratory Function Stable, Patent Airway, No signs of Nausea or vomiting, Adequate PO intake, Pain level controlled, No headache, No backache, No residual numbness and No residual motor weakness  Post-op Vital Signs: Reviewed and stable  Complications: No apparent anesthesia complications

## 2013-03-24 NOTE — Lactation Note (Signed)
This note was copied from the chart of Tracey [place]. Lactation Consultation Noete  Follow up consutl with this mom of a term baby, now 16 hours old. Mom was set up with a DEP, but has not been pumping "because I was not getting anything" I explained that she needs to pump to stimulat her breasts, if the baby is not feeding, and then hand express, and feed the EBM to her baby. Mom has been hand expressing tiny drops to the baby, and the baby has been fed some formula bottles in the CNS. I assisted mom with latching in football hold, and she latched well, lips flanged, strong suckles briefly, but then sleepy . Mom had just fed the baby 10 mls of formula prior to my coming in the rrom. I told mom to call me when she latches baby next time.   Patient Name: Tracey Leonard [id number] Date: 03/24/2013 Reason for consult: Follow-up assessment   Maternal Data    Feeding Feeding Type: Breast Milk Feeding method: Breast Length of feed: 3 min  LATCH Score/Interventions Latch: Repeated attempts needed to sustain latch, nipple held in mouth throughout feeding, stimulation needed to elicit sucking reflex. Intervention(s): Adjust position;Assist with latch;Breast compression  Audible Swallowing: None Intervention(s): Skin to skin;Hand expression;Alternate breast massage  Type of Nipple: Everted at rest and after stimulation  Comfort (Breast/Nipple): Soft / non-tender     Hold (Positioning): Assistance needed to correctly position infant at breast and maintain latch. Intervention(s): Breastfeeding basics reviewed;Support Pillows;Position options;Skin to skin  LATCH Score: 6  Lactation Tools Discussed/Used Tools: Pump Breast pump type: Double-Electric Breast Pump WIC Program: Yes   Consult Status Consult Status: Follow-up Date: 03/25/13 Follow-up type: In-patient    Alfred Levins 03/24/2013, 12:37 PM

## 2013-03-24 NOTE — Progress Notes (Signed)
CSW received consult to see MOB due to possible cognitive limitations.  CSW reviewed PNR and hospital notes in MOB and baby's charts, and cannot find any documentation of this issue.  CSW called bedside RN who states MOB has been caring for baby appropriately throughout hospitalization and is unaware of this diagnosis as well.  CSW is screening out referral at this time, but asked RN to contact CSW if issues/concerns arise or if MOB requests to speak with a CSW.

## 2013-03-24 NOTE — Progress Notes (Signed)
Ur chart review completed.  

## 2013-04-04 NOTE — Discharge Summary (Signed)
  Vaginal Delivery Discharge Summary  Tracey Leonard  DOB:    1986/03/31 MRN:    [id number] CSN:    [id number]  Date of admission:                  03/22/2013  Date of discharge:                   03/24/2013  Procedures this admission: pitocin augmentation, SVD, epidural, PIH labs, prot/creat ratio,   Date of Delivery: 03/22/2013   Newborn Data:  Live born female  Birth Weight: 6 lb 2.2 oz (2784 g) APGAR: 8, 9  Home with mother.  Circumcision Plan: n/a   History of Present Illness:  Tracey Leonard is a 27 y.o. female, G1P1001, who presents at [redacted]w[redacted]d weeks gestation. The patient has been followed at the [facility]cology division of [facility]. She was admitted in early labor, FHR variables, decreased AFI, w known oligo, and mildly elevated BP's . Her pregnancy has been complicated by: Deatra James oligo,   Hospital course:  The patient was admitted for augmentation of labor due to, oligo, FHR variables, and GHTN.   Her labor was not complicated. She proceeded to have a vaginal delivery of a healthy infant by V.Emilee Hero, CNM. Her delivery was not complicated. Her postpartum course was not complicated.  She was discharged to home on postpartum day 2 doing well.  Feeding:  breast and bottle  Contraception:  nexplanon  Discharge hemoglobin:  Hemoglobin  Date Value Range Status  03/24/2013 9.7* 12.0 - 15.0 g/dL Final     HCT  Date Value Range Status  03/24/2013 30.0* 36.0 - 46.0 % Final    Discharge Physical Exam:   General: alert and no distress Lochia: appropriate Uterine Fundus: firm Incision: healing well DVT Evaluation: No evidence of DVT seen on physical exam. Negative Homan's sign. No significant calf/ankle edema.  Intrapartum Procedures: spontaneous vaginal delivery and labor epidural, PIH labs, prot/creat ratio Postpartum Procedures: none Complications-Operative and Postpartum: 2nd degree laceration  Discharge  Diagnoses: Term Pregnancy-delivered  Discharge Information:  Activity:           pelvic rest Diet:                routine and iron-rich Medications: PNV, Ibuprofen and Iron Condition:      stable Instructions:  routine printed instructions Discharge to: home  Follow-up Information   Follow up with [facility] In 5 weeks.   Contact information:   [address] [place] [address] [phone]       Malissa Hippo 04/04/2013

## 2013-07-01 ENCOUNTER — Encounter (HOSPITAL_COMMUNITY): Payer: Self-pay | Admitting: Emergency Medicine

## 2013-07-01 ENCOUNTER — Emergency Department (INDEPENDENT_AMBULATORY_CARE_PROVIDER_SITE_OTHER)
Admission: EM | Admit: 2013-07-01 | Discharge: 2013-07-01 | Disposition: A | Discharge status: Home / Self Care | Payer: Medicaid Other | Source: Home / Self Care | Attending: Family Medicine | Admitting: Family Medicine

## 2013-07-01 DIAGNOSIS — M67431 Ganglion, right wrist: Secondary | ICD-10-CM

## 2013-07-01 DIAGNOSIS — M6740 Ganglion, unspecified site: Secondary | ICD-10-CM

## 2013-07-01 MED ORDER — METHYLPREDNISOLONE ACETATE 40 MG/ML IJ SUSP
INTRAMUSCULAR | Status: AC
  Filled 2013-07-01: qty 5

## 2013-07-01 MED ORDER — METHYLPREDNISOLONE ACETATE 40 MG/ML IJ SUSP: 5.0000 mg | Freq: Once | INTRAMUSCULAR | Status: DC

## 2013-07-01 NOTE — ED Provider Notes (Signed)
Tracey Leonard is a 27 y.o. female who presents to Urgent Care today for right dorsal wrist pain and swelling. Patient notes a mildly firm mildly tender bump on the radial dorsal aspect of right wrist. This has happened a few weeks ago. She did not HR fell on her right wrist about one month ago however her symptoms are not directly related to this area she denies any radiating pain weakness numbness fevers or chills. She feels well otherwise.   Past Medical History  Diagnosis Date  . Infection     UTI  07/15/12  . Infection     07/2012  . Infection     07/2012  . Hypertension   . Pregnancy induced hypertension    History  Substance Use Topics  . Smoking status: Never Smoker   . Smokeless tobacco: Never Used  . Alcohol Use: No   ROS as above Medications reviewed. Current Facility-Administered Medications  Medication Dose Route Frequency Provider Last Rate Last Dose  . methylPREDNISolone acetate (DEPO-MEDROL) injection 5.2 mg  5.2 mg Intra-articular Once Rodolph Bong, MD       Current Outpatient Prescriptions  Medication Sig Dispense Refill  . ibuprofen (ADVIL,MOTRIN) 600 MG tablet Take 1 tablet (600 mg total) by mouth every 6 (six) hours.  30 tablet  2  . iron polysaccharides (NIFEREX) 150 MG capsule Take 1 capsule (150 mg total) by mouth daily.  30 capsule  2    Exam:  BP 120/78  Pulse 80  Temp(Src) 98.2 F (36.8 C) (Oral)  Resp 12  SpO2 100%  Breastfeeding? No Gen: Well NAD RIGHT WRIST: Ganglion cyst dorsal radial aspect. Nontender otherwise normal motion Capillary refill sensation And pulses intact distally Grip strength intact  Procedure note right dorsal wrist ganglion cyst aspiration and injection Consent obtained and timeout performed. Discussed risks and benefits including skin hypopigmentation infection. Skin cleaned with alcohol, cold spray applied, and 1 mm of lidocaine injected superficially overlying the ganglion cyst The skin was again cleaned with  alcohol and 18-gauge needle was used to attempt to aspirate the ganglion cyst. A small amount of fluid was aspirated.. no further fluid was able to be aspirated or expressed. The skin was again cleaned with alcohol and 18-gauge needle was used to inject 5 mL of Depo-Medrol and 0.2 mL of lidocaine into the area of the ganglion cyst. Patient tolerated the procedure well.    Assessment and Plan: 27 y.o. female with right dorsal wrist ganglion cyst status post aspiration and injection. Ibuprofen for pain as needed Followup with hand surgery if not improving Discussed warning signs or symptoms. Please see discharge instructions. Patient expresses understanding.      Rodolph Bong, MD 07/01/13 1230

## 2013-07-01 NOTE — ED Notes (Signed)
C/o right hand pain that comes and goes. Mild relief with aleve. States while shopping a month ago a large jar fell on right wrist and has been having pain since.

## 2013-07-24 ENCOUNTER — Other Ambulatory Visit: Payer: Self-pay

## 2013-09-15 ENCOUNTER — Emergency Department (HOSPITAL_COMMUNITY)
Admission: EM | Admit: 2013-09-15 | Discharge: 2013-09-15 | Disposition: A | Discharge status: Home / Self Care | Payer: Medicaid Other | Attending: Emergency Medicine | Admitting: Emergency Medicine

## 2013-09-15 ENCOUNTER — Emergency Department (HOSPITAL_COMMUNITY): Payer: Medicaid Other

## 2013-09-15 ENCOUNTER — Encounter (HOSPITAL_COMMUNITY): Payer: Self-pay | Admitting: Emergency Medicine

## 2013-09-15 DIAGNOSIS — Z8619 Personal history of other infectious and parasitic diseases: Secondary | ICD-10-CM | POA: Insufficient documentation

## 2013-09-15 DIAGNOSIS — K8020 Calculus of gallbladder without cholecystitis without obstruction: Secondary | ICD-10-CM

## 2013-09-15 DIAGNOSIS — Z792 Long term (current) use of antibiotics: Secondary | ICD-10-CM | POA: Insufficient documentation

## 2013-09-15 DIAGNOSIS — R112 Nausea with vomiting, unspecified: Secondary | ICD-10-CM | POA: Insufficient documentation

## 2013-09-15 DIAGNOSIS — I10 Essential (primary) hypertension: Secondary | ICD-10-CM | POA: Insufficient documentation

## 2013-09-15 DIAGNOSIS — Z880 Allergy status to penicillin: Secondary | ICD-10-CM | POA: Insufficient documentation

## 2013-09-15 DIAGNOSIS — Z3202 Encounter for pregnancy test, result negative: Secondary | ICD-10-CM | POA: Insufficient documentation

## 2013-09-15 DIAGNOSIS — N390 Urinary tract infection, site not specified: Secondary | ICD-10-CM | POA: Insufficient documentation

## 2013-09-15 LAB — COMPREHENSIVE METABOLIC PANEL
Alkaline Phosphatase: 93 U/L (ref 39–117)
BUN: 13 mg/dL (ref 6–23)
Creatinine, Ser: 0.76 mg/dL (ref 0.50–1.10)
GFR calc Af Amer: >90 mL/min (ref >90)
Glucose, Bld: 138 mg/dL — ABNORMAL HIGH (ref 70–99)
Potassium: 3.5 mEq/L (ref 3.5–5.1)
Total Bilirubin: 0.3 mg/dL (ref 0.3–1.2)
Total Protein: 7.3 g/dL (ref 6.0–8.3)

## 2013-09-15 LAB — CBC WITH DIFFERENTIAL/PLATELET
Eosinophils Absolute: 0.1 10*3/uL (ref 0.0–0.7)
HCT: 34.9 % — ABNORMAL LOW (ref 36.0–46.0)
Hemoglobin: 11.4 g/dL — ABNORMAL LOW (ref 12.0–15.0)
Lymphs Abs: 2 10*3/uL (ref 0.7–4.0)
MCH: 26.2 pg (ref 26.0–34.0)
MCV: 80.2 fL (ref 78.0–100.0)
Monocytes Absolute: 0.5 10*3/uL (ref 0.1–1.0)
Monocytes Relative: 4 % (ref 3–12)
Neutrophils Relative %: 79 % — ABNORMAL HIGH (ref 43–77)
RBC: 4.35 MIL/uL (ref 3.87–5.11)

## 2013-09-15 LAB — URINALYSIS, ROUTINE W REFLEX MICROSCOPIC
Glucose, UA: NEGATIVE mg/dL
Hgb urine dipstick: NEGATIVE
Protein, ur: NEGATIVE mg/dL
pH: 6 (ref 5.0–8.0)

## 2013-09-15 LAB — URINE MICROSCOPIC-ADD ON

## 2013-09-15 LAB — LIPASE, BLOOD: Lipase: 32 U/L (ref 11–59)

## 2013-09-15 LAB — POCT PREGNANCY, URINE: Preg Test, Ur: NEGATIVE

## 2013-09-15 MED ORDER — CEPHALEXIN 500 MG PO CAPS: 500.0000 mg | ORAL_CAPSULE | Freq: Two times a day (BID) | ORAL | Status: DC

## 2013-09-15 MED ORDER — SODIUM CHLORIDE 0.9 % IV BOLUS (SEPSIS)
1000.0000 mL | Freq: Once | INTRAVENOUS | Status: AC
  Administered 2013-09-15: 1000 mL via INTRAVENOUS

## 2013-09-15 MED ORDER — OXYCODONE-ACETAMINOPHEN 5-325 MG PO TABS: 1.0000 | ORAL_TABLET | ORAL | Status: DC | PRN

## 2013-09-15 NOTE — ED Provider Notes (Signed)
CSN: [id number]     Arrival date & time 09/15/13  0230 History   First MD Initiated Contact with Patient 09/15/13 0256     Chief Complaint  Patient presents with  . Abdominal Pain   (Consider location/radiation/quality/duration/timing/severity/associated sxs/prior Treatment) HPI Comments: Patient is a 27 year old female with no significant past medical history who presents today for epigastric pain. Patient states that pain has been constant since last evening and has spontaneously improved since onset. She now rates her pain at 6/10. She describes the pain as aching in nature and states it is nonradiating. She denies any aggravating or alleviating factors of her symptoms. Patient states that she has had similar pain in the past but does not know what is causing her symptoms. She endorses associated nonbloody, nonbilious emesis sporadically as well as nausea. Patient denies fever, chest pain, shortness of breath or pain with deep inspiration, diarrhea, melena or hematochezia, urinary symptoms. Patient denies history of abdominal surgeries. Patient is no longer breastfeeding.  Patient is a 27 y.o. female presenting with abdominal pain. The history is provided by the patient. No language interpreter was used.  Abdominal Pain Associated symptoms: nausea and vomiting     Past Medical History  Diagnosis Date  . Infection     UTI  07/15/12  . Infection     07/2012  . Infection     07/2012  . Hypertension   . Pregnancy induced hypertension    Past Surgical History  Procedure Laterality Date  . No past surgeries     Family History  Problem Relation Age of Onset  . Other Father     MVA   History  Substance Use Topics  . Smoking status: Never Smoker   . Smokeless tobacco: Never Used  . Alcohol Use: No   OB History   Grav Para Term Preterm Abortions TAB SAB Ect Mult Living   1 1 1       1      Review of Systems  Gastrointestinal: Positive for nausea, vomiting and abdominal pain.   All other systems reviewed and are negative.   Allergies  Amoxicillin  Home Medications   Current Outpatient Rx  Name  Route  Sig  Dispense  Refill  . cephALEXin (KEFLEX) 500 MG capsule   Oral   Take 1 capsule (500 mg total) by mouth 2 (two) times daily.   14 capsule   0   . oxyCODONE-acetaminophen (PERCOCET/ROXICET) 5-325 MG per tablet   Oral   Take 1 tablet by mouth every 4 (four) hours as needed for severe pain.   7 tablet   0    BP 129/72  Pulse 78  Temp(Src) 97.8 F (36.6 C) (Oral)  Resp 16  Ht 5\' 4"  (1.626 m)  Wt 167 lb (75.751 kg)  BMI 28.65 kg/m2  SpO2 100%  Breastfeeding? Yes  Physical Exam  Nursing note and vitals reviewed. Constitutional: She is oriented to person, place, and time. She appears well-developed and well-nourished. No distress.  HENT:  Head: Normocephalic and atraumatic.  Mouth/Throat: Oropharynx is clear and moist. No oropharyngeal exudate.  Eyes: Conjunctivae and EOM are normal. No scleral icterus.  Neck: Normal range of motion. Neck supple.  Cardiovascular: Normal rate, regular rhythm and normal heart sounds.   Pulmonary/Chest: Effort normal and breath sounds normal. No respiratory distress. She has no wheezes. She has no rales.  Abdominal: Soft. She exhibits no distension and no mass. There is tenderness (mild in epigastric region). There is no rebound  and no guarding.  No guarding or peritoneal signs. No Murphy's sign.  Musculoskeletal: Normal range of motion.  Neurological: She is alert and oriented to person, place, and time.  Skin: Skin is warm and dry. No rash noted. She is not diaphoretic. No erythema. No pallor.  Psychiatric: She has a normal mood and affect. Her behavior is normal.    ED Course  Procedures (including critical care time) Labs Review Labs Reviewed  CBC WITH DIFFERENTIAL - Abnormal; Notable for the following:    WBC 12.0 (*)    Hemoglobin 11.4 (*)    HCT 34.9 (*)    Neutrophils Relative % 79 (*)    Neutro  Abs 9.5 (*)    All other components within normal limits  COMPREHENSIVE METABOLIC PANEL - Abnormal; Notable for the following:    Glucose, Bld 138 (*)    AST 70 (*)    ALT 41 (*)    All other components within normal limits  URINALYSIS, ROUTINE W REFLEX MICROSCOPIC - Abnormal; Notable for the following:    APPearance CLOUDY (*)    Leukocytes, UA LARGE (*)    All other components within normal limits  URINE MICROSCOPIC-ADD ON - Abnormal; Notable for the following:    Squamous Epithelial / LPF FEW (*)    Bacteria, UA MANY (*)    All other components within normal limits  URINE CULTURE  LIPASE, BLOOD  POCT PREGNANCY, URINE   Imaging Review Dg Chest 2 View  09/15/2013   CLINICAL DATA:  Shortness of breath and epigastric pain.  EXAM: CHEST  2 VIEW  COMPARISON:  Chest radiograph report April 23, 1994 though images not available for direct comparison.  FINDINGS: Cardiomediastinal silhouette is unremarkable. The lungs are clear without pleural effusions or focal consolidations. Pulmonary vasculature is unremarkable. Trachea projects midline and there is no pneumothorax. Soft tissue planes and included osseous structures are nonsuspicious.  IMPRESSION: No acute cardiopulmonary process: Normal chest radiograph.   Electronically Signed   By: Awilda Metro   On: 09/15/2013 04:04   US Abdomen Complete  09/15/2013   CLINICAL DATA:  Epigastric pain for several months. Increased liver function tests. Increased white cell count. Postpartum 6 months.  EXAM: ULTRASOUND ABDOMEN COMPLETE  COMPARISON:  None.  FINDINGS: Gallbladder:  The gallbladder is contracted with multiple stones. No significant wall thickening or edema. Murphy's sign is negative.  Common bile duct:  Diameter: 3.4 mm, normal  Liver:  No focal lesion identified. Within normal limits in parenchymal echogenicity.  IVC:  No abnormality visualized.  Pancreas:  Visualized portion unremarkable.  Spleen:  Size and appearance within normal limits.   Right Kidney:  Length: 10 cm. Echogenicity within normal limits. No mass or hydronephrosis visualized.  Left Kidney:  Length: 10.9 cm. Echogenicity within normal limits. No mass or hydronephrosis visualized.  Abdominal aorta:  No aneurysm visualized.  Other findings:  None.  IMPRESSION: Cholelithiasis with contracted gallbladder. No additional inflammatory changes.   Electronically Signed   By: Burman Nieves M.D.   On: 09/15/2013 05:46    EKG Interpretation   None       MDM   1. Cholelithiasis   2. UTI (urinary tract infection)    27 year old female presents for epigastric pain with onset yesterday evening. Patient has experienced similar episodes of pain in the past few months which resolved spontaneously. Symptoms associated with nausea as well as sporadic nonbloody, nonbilious emesis. On physical exam today patient is in no visible are audible discomfort. She  is well and nontoxic appearing, hemodynamically stable, and afebrile. Abdominal exam illicits tenderness only with deep palpation to the epigastrium. No peritoneal signs, Murphy's sign, or tenderness at McBurney's point appreciated. Patient states that symptoms have improved since her arrival in the ED this evening.  Labs and ED significant for mild leukocytosis of 12. Patient also with mildly elevated LFTs. Urinalysis suggests infection today; however, patient denies dysuria. Abdominal ultrasound ordered for further evaluation of symptoms which shows cholelithiasis without evidence of cholecystitis. Ultrasound findings suggest that patient's symptoms are likely secondary to biliary colic. Patient has not required any pain medicine or antiemetics while in ED today. Reexamination of the abdomen stable. Patient stable and appropriate for discharge with general surgery followup. Will prescribe Percocet for pain control as needed and Keflex for UTI. Return precautions discussed with the patient who verbalizes comfort and understanding with  this discharge plan with no unaddressed concerns.    Antony Madura, PA-C 09/21/13 2037

## 2013-09-15 NOTE — ED Notes (Signed)
Pt complains of right upper abdominal pain since last night

## 2013-09-15 NOTE — ED Notes (Signed)
Bed: ZO10 Expected date:  Expected time:  Means of arrival:  Comments: abd pain, vomiting

## 2013-09-16 LAB — URINE CULTURE

## 2013-09-22 NOTE — ED Provider Notes (Signed)
Medical screening examination/treatment/procedure(s) were performed by non-physician practitioner and as supervising physician I was immediately available for consultation/collaboration.  EKG Interpretation   None         Audree CamelScott T Joanmarie Tsang, MD 09/22/13 [phone]

## 2013-09-26 ENCOUNTER — Ambulatory Visit (INDEPENDENT_AMBULATORY_CARE_PROVIDER_SITE_OTHER): Payer: Medicaid Other | Admitting: General Surgery

## 2013-09-26 ENCOUNTER — Encounter (INDEPENDENT_AMBULATORY_CARE_PROVIDER_SITE_OTHER): Payer: Self-pay | Admitting: General Surgery

## 2013-09-26 VITALS — BP 130/74 | HR 80 | Temp 98.6°F | Resp 14 | Ht 64.0 in | Wt 168.8 lb

## 2013-09-26 DIAGNOSIS — K8020 Calculus of gallbladder without cholecystitis without obstruction: Secondary | ICD-10-CM | POA: Insufficient documentation

## 2013-09-26 NOTE — Progress Notes (Signed)
Patient ID: Tracey Leonard, female   DOB: 1985/10/29, 28 y.o.   MRN: [id number]  Chief Complaint  Patient presents with  . New Evaluation    eval GB    HPI Tracey Leonard is a 28 y.o. female.  She is referred by Dr. Pricilla LovelessScott Goldston in the emergency department for evaluation of recurrent biliary colic and gallstones and elevated liver function tests.  She states that she has a two-month history of intermittent attacks of epigastric pain, sometimes postprandial, usually associated with nausea or vomiting. This was easily resolved but has been occurring about every 3 or 4 days now. She denies any history of liver disease or jaundice or hepatitis. She denies any history of cardiac disease pulmonary disease or renal disease. [facility] she was found to have  WBC 12,000, hemoglobin 11.4, AST 70, ALT 41. Normal lipase. Urinalysis of urinary tract infection she was given Keflex for that. She continues to have episodes of pain.  She is here today with her infant child  She is single with a single child. She is unemployed. She denies alcohol or tobacco. She did receive child support ptosis with caring for her child. She is otherwise healthy.  HPI  Past Medical History  Diagnosis Date  . Infection     UTI  07/15/12  . Infection     07/2012  . Infection     07/2012  . Hypertension   . Pregnancy induced hypertension     Past Surgical History  Procedure Laterality Date  . No past surgeries      Family History  Problem Relation Age of Onset  . Other Father     MVA    Social History History  Substance Use Topics  . Smoking status: Never Smoker   . Smokeless tobacco: Never Used  . Alcohol Use: No    Allergies  Allergen Reactions  . Amoxicillin Itching and Rash    Current Outpatient Prescriptions  Medication Sig Dispense Refill  . acetaminophen (TYLENOL) 325 MG tablet Take 650 mg by mouth every 6 (six) hours as needed.      Marland Kitchen. estradiol cypionate  (DEPO-ESTRADIOL) 5 MG/ML injection Inject into the muscle every 28 (twenty-eight) days.       No current facility-administered medications for this visit.    Review of Systems Review of Systems  Constitutional: Negative for fever, chills and unexpected weight change.  HENT: Negative for congestion, hearing loss, sore throat, trouble swallowing and voice change.   Eyes: Negative for visual disturbance.  Respiratory: Negative for cough and wheezing.   Cardiovascular: Negative for chest pain, palpitations and leg swelling.  Gastrointestinal: Positive for nausea, vomiting and abdominal pain. Negative for diarrhea, constipation, blood in stool, abdominal distention and anal bleeding.  Genitourinary: Negative for hematuria, vaginal bleeding and difficulty urinating.  Musculoskeletal: Negative for arthralgias.  Skin: Negative for rash and wound.  Neurological: Negative for seizures, syncope and headaches.  Hematological: Negative for adenopathy. Does not bruise/bleed easily.  Psychiatric/Behavioral: Negative for confusion.    Blood pressure 130/74, pulse 80, temperature 98.6 F (37 C), temperature source Temporal, resp. rate 14, height 5\' 4"  (1.626 m), weight 168 lb 12.8 oz (76.567 kg).  Physical Exam Physical Exam  Constitutional: She is oriented to person, place, and time. She appears well-developed and well-nourished. No distress.  HENT:  Head: Normocephalic and atraumatic.  Nose: Nose normal.  Mouth/Throat: No oropharyngeal exudate.  Eyes: Conjunctivae and EOM are normal. Pupils are equal, round, and  reactive to light. Left eye exhibits no discharge. No scleral icterus.  Neck: Neck supple. No JVD present. No tracheal deviation present. No thyromegaly present.  Cardiovascular: Normal rate, regular rhythm, normal heart sounds and intact distal pulses.   No murmur heard. Pulmonary/Chest: Effort normal and breath sounds normal. No respiratory distress. She has no wheezes. She has no  rales. She exhibits no tenderness.  Abdominal: Soft. Bowel sounds are normal. She exhibits no distension and no mass. There is no tenderness. There is no rebound and no guarding.  Abdominal exam is essentially benign. No tenderness. No mass. No hernia. The umbilicus normal although the umbilicus is hyperpigmented.  Musculoskeletal: She exhibits no edema and no tenderness.  Lymphadenopathy:    She has no cervical adenopathy.  Neurological: She is alert and oriented to person, place, and time. She exhibits normal muscle tone. Coordination normal.  Skin: Skin is warm. No rash noted. She is not diaphoretic. No erythema. No pallor.  Psychiatric: She has a normal mood and affect. Her behavior is normal. Judgment and thought content normal.    Data Reviewed The emergency department record, labs, ultrasound  Assessment    Chronic cholecystitis with cholelithiasis. She has multiple gallstones in a similar she's having accelerating biliary colic.  Postpartum  Pregnancy induced hypertension  Recent UTI, treated     Plan    At a long talk with the patient advised her to have a cholecystectomy. She seems willing to have this done  She'll be scheduled for laparoscopic cholecystectomy with cholangiogram, possible open  I discussed the indications, details, techniques, and numerous risk of the surgery with her. I whenever the patient information booklet in detail with diagrams. She is aware of the risk of bleeding, infection, conversion to open laparotomy, bile leak, hernia, injury to adjacent organs of major reconstructive surgery, and other unforeseen problems. She understands all these issues. All of her questions were answered. She agrees with this plan.       Angelia Mould. Derrell Lolling, M.D., [facility] General and Minimally invasive Surgery Breast and Colorectal Surgery Office:   [phone] Pager:   [phone]  09/26/2013, 4:11 PM

## 2013-09-26 NOTE — Patient Instructions (Signed)
You had been having frequent attacks of gallbladder pain over the past 2 months. You state that this has been happening twice a week and is associated with nausea and vomiting.  Your recent evaluation in the emergency room revealed multiple gallstones, abnormal liver function tests, and elevated white blood cell count. You were also given a prescription for Keflex for a urinary tract infection  You'll be scheduled for a laparoscopic cholecystectomy with cholangiogram in the near future, as soon as you are willing to do this.      Laparoscopic Cholecystectomy Laparoscopic cholecystectomy is surgery to remove the gallbladder. The gallbladder is located slightly to the right of center in the abdomen, behind the liver. It is a concentrating and storage sac for the bile produced in the liver. Bile aids in the digestion and absorption of fats. Gallbladder disease (cholecystitis) is an inflammation of your gallbladder. This condition is usually caused by a buildup of gallstones (cholelithiasis) in your gallbladder. Gallstones can block the flow of bile, resulting in inflammation and pain. In severe cases, emergency surgery may be required. When emergency surgery is not required, you will have time to prepare for the procedure. Laparoscopic surgery is an alternative to open surgery. Laparoscopic surgery usually has a shorter recovery time. Your common bile duct may also need to be examined and explored. Your caregiver will discuss this with you if he or she feels this should be done. If stones are found in the common bile duct, they may be removed. LET YOUR CAREGIVER KNOW ABOUT:  Allergies to food or medicine.  Medicines taken, including vitamins, herbs, eyedrops, over-the-counter medicines, and creams.  Use of steroids (by mouth or creams).  Previous problems with anesthetics or numbing medicines.  History of bleeding problems or blood clots.  Previous surgery.  Other health problems, including  diabetes and kidney problems.  Possibility of pregnancy, if this applies. RISKS AND COMPLICATIONS All surgery is associated with risks. Some problems that may occur following this procedure include:  Infection.  Damage to the common bile duct, nerves, arteries, veins, or other internal organs such as the stomach or intestines.  Bleeding.  A stone may remain in the common bile duct. BEFORE THE PROCEDURE  Do not take aspirin for 3 days prior to surgery or blood thinners for 1 week prior to surgery.  Do not eat or drink anything after midnight the night before surgery.  Let your caregiver know if you develop a cold or other infectious problem prior to surgery.  You should be present 60 minutes before the procedure or as directed. PROCEDURE  You will be given medicine that makes you sleep (general anesthetic). When you are asleep, your surgeon will make several small cuts (incisions) in your abdomen. One of these incisions is used to insert a small, lighted scope (laparoscope) into the abdomen. The laparoscope helps the surgeon see into your abdomen. Carbon dioxide gas will be pumped into your abdomen. The gas allows more room for the surgeon to perform your surgery. Other operating instruments are inserted through the other incisions. Laparoscopic procedures may not be appropriate when:  There is major scarring from previous surgery.  The gallbladder is extremely inflamed.  There are bleeding disorders or unexpected cirrhosis of the liver.  A pregnancy is near term.  Other conditions make the laparoscopic procedure impossible. If your surgeon feels it is not safe to continue with a laparoscopic procedure, he or she will perform an open abdominal procedure. In this case, the surgeon will make  an incision to open the abdomen. This gives the surgeon a larger view and field to work within. This may allow the surgeon to perform procedures that sometimes cannot be performed with a  laparoscope alone. Open surgery has a longer recovery time. AFTER THE PROCEDURE  You will be taken to the recovery area where a nurse will watch and check your progress.  You may be allowed to go home the same day.  Do not resume physical activities until directed by your caregiver.  You may resume a normal diet and activities as directed. Document Released: 09/04/2005 Document Revised: 11/27/2011 Document Reviewed: 04/16/2013 [facility] Patient Information 2014 [employer] [place].

## 2013-10-13 ENCOUNTER — Ambulatory Visit (INDEPENDENT_AMBULATORY_CARE_PROVIDER_SITE_OTHER): Payer: Medicaid Other | Admitting: Surgery

## 2013-10-13 ENCOUNTER — Encounter (INDEPENDENT_AMBULATORY_CARE_PROVIDER_SITE_OTHER): Payer: Self-pay | Admitting: Surgery

## 2013-10-13 VITALS — BP 118/78 | HR 88 | Temp 98.2°F | Resp 15 | Ht 64.0 in | Wt 164.4 lb

## 2013-10-13 DIAGNOSIS — K8010 Calculus of gallbladder with chronic cholecystitis without obstruction: Secondary | ICD-10-CM

## 2013-10-13 NOTE — Progress Notes (Signed)
Patient ID: Tracey Leonard, female   DOB: 06-May-1986, 28 y.o.   MRN: [id number]  Chief Complaint  Patient presents with  . Routine Post Op    Eval GB    HPI Tracey Leonard is a 28 y.o. female.  Seen in ED for gallstones  HPI Tracey Leonard is a 28 y.o. female. She is referred by Dr. Pricilla Loveless in the emergency department for evaluation of recurrent biliary colic and gallstones and elevated liver function tests.  She states that she has a two-month history of intermittent attacks of epigastric pain, sometimes postprandial, usually associated with nausea or vomiting. This was easily resolved but has been occurring about every 3 or 4 days now. She denies any history of liver disease or jaundice or hepatitis.  She is here today with her infant child  She is single with a single child. She is unemployed. She denies alcohol or tobacco.   Past Medical History  Diagnosis Date  . Infection     UTI  07/15/12  . Infection     07/2012  . Infection     07/2012  . Hypertension   . Pregnancy induced hypertension     Past Surgical History  Procedure Laterality Date  . No past surgeries      Family History  Problem Relation Age of Onset  . Other Father     MVA    Social History History  Substance Use Topics  . Smoking status: Never Smoker   . Smokeless tobacco: Never Used  . Alcohol Use: No    Allergies  Allergen Reactions  . Amoxicillin Itching and Rash    Current Outpatient Prescriptions  Medication Sig Dispense Refill  . estradiol cypionate (DEPO-ESTRADIOL) 5 MG/ML injection Inject into the muscle every 28 (twenty-eight) days.       No current facility-administered medications for this visit.    Review of Systems Review of Systems  Constitutional: Negative for fever, chills and unexpected weight change.  HENT: Negative for congestion, hearing loss, sore throat, trouble swallowing and voice change.   Eyes: Negative for visual disturbance.  Respiratory: Negative for  cough and wheezing.   Cardiovascular: Negative for chest pain, palpitations and leg swelling.  Gastrointestinal: Positive for nausea, abdominal pain and abdominal distention. Negative for vomiting, diarrhea, constipation, blood in stool and anal bleeding.  Genitourinary: Negative for hematuria, vaginal bleeding and difficulty urinating.  Musculoskeletal: Negative for arthralgias.  Skin: Negative for rash and wound.  Neurological: Negative for seizures, syncope and headaches.  Hematological: Negative for adenopathy. Does not bruise/bleed easily.  Psychiatric/Behavioral: Negative for confusion.    Blood pressure 118/78, pulse 88, temperature 98.2 F (36.8 C), temperature source Temporal, resp. rate 15, height 5\' 4"  (1.626 m), weight 164 lb 6.4 oz (74.571 kg).  Physical Exam Physical Exam WDWN in NAD HEENT:  EOMI, sclera anicteric Neck:  No masses, no thyromegaly Lungs:  CTA bilaterally; normal respiratory effort CV:  Regular rate and rhythm; no murmurs Abd:  +bowel sounds, soft, non-tender Ext:  Well-perfused; no edema Skin:  Warm, dry; no sign of jaundice  Data Reviewed Dg Chest 2 View  09/15/2013   CLINICAL DATA:  Shortness of breath and epigastric pain.  EXAM: CHEST  2 VIEW  COMPARISON:  Chest radiograph report April 23, 1994 though images not available for direct comparison.  FINDINGS: Cardiomediastinal silhouette is unremarkable. The lungs are clear without pleural effusions or focal consolidations. Pulmonary vasculature is unremarkable. Trachea projects midline and there is no pneumothorax. Soft  tissue planes and included osseous structures are nonsuspicious.  IMPRESSION: No acute cardiopulmonary process: Normal chest radiograph.   Electronically Signed   By: Awilda Metroourtnay  Bloomer   On: 09/15/2013 04:04   [place] Abdomen Complete  09/15/2013   CLINICAL DATA:  Epigastric pain for several months. Increased liver function tests. Increased white cell count. Postpartum 6 months.  EXAM:  ULTRASOUND ABDOMEN COMPLETE  COMPARISON:  None.  FINDINGS: Gallbladder:  The gallbladder is contracted with multiple stones. No significant wall thickening or edema. Murphy's sign is negative.  Common bile duct:  Diameter: 3.4 mm, normal  Liver:  No focal lesion identified. Within normal limits in parenchymal echogenicity.  IVC:  No abnormality visualized.  Pancreas:  Visualized portion unremarkable.  Spleen:  Size and appearance within normal limits.  Right Kidney:  Length: 10 cm. Echogenicity within normal limits. No mass or hydronephrosis visualized.  Left Kidney:  Length: 10.9 cm. Echogenicity within normal limits. No mass or hydronephrosis visualized.  Abdominal aorta:  No aneurysm visualized.  Other findings:  None.  IMPRESSION: Cholelithiasis with contracted gallbladder. No additional inflammatory changes.   Electronically Signed   By: Burman NievesWilliam  Stevens M.D.   On: 09/15/2013 05:46    Lab Results  Component Value Date   WBC 12.0* 09/15/2013   HGB 11.4* 09/15/2013   HCT 34.9* 09/15/2013   MCV 80.2 09/15/2013   PLT 274 09/15/2013   Lab Results  Component Value Date   CREATININE 0.76 09/15/2013   BUN 13 09/15/2013   NA 137 09/15/2013   K 3.5 09/15/2013   CL 103 09/15/2013   CO2 22 09/15/2013   Lab Results  Component Value Date   ALT 41* 09/15/2013   AST 70* 09/15/2013   ALKPHOS 93 09/15/2013   BILITOT 0.3 09/15/2013     Assessment    Chronic calculus cholecystitis      Plan    Laparoscopic cholecystectomy with intraoperative cholangiogram.  The surgical procedure has been discussed with the patient.  Potential risks, benefits, alternative treatments, and expected outcomes have been explained.  All of the patient's questions at this time have been answered.  The likelihood of reaching the patient's treatment goal is good.  The patient understand the proposed surgical procedure and wishes to proceed.         Amybeth Sieg K. 10/13/2013, 11:47 AM

## 2013-11-05 ENCOUNTER — Ambulatory Visit (HOSPITAL_COMMUNITY): Admit: 2013-11-05 | Payer: Medicaid Other | Admitting: Surgery

## 2013-11-05 ENCOUNTER — Encounter (HOSPITAL_COMMUNITY): Payer: Self-pay

## 2013-11-05 SURGERY — LAPAROSCOPIC CHOLECYSTECTOMY WITH INTRAOPERATIVE CHOLANGIOGRAM
Anesthesia: General

## 2014-01-12 ENCOUNTER — Encounter (HOSPITAL_BASED_OUTPATIENT_CLINIC_OR_DEPARTMENT_OTHER): Payer: Self-pay | Admitting: Emergency Medicine

## 2014-01-12 ENCOUNTER — Emergency Department (HOSPITAL_BASED_OUTPATIENT_CLINIC_OR_DEPARTMENT_OTHER)
Admission: EM | Admit: 2014-01-12 | Discharge: 2014-01-12 | Disposition: A | Discharge status: Home / Self Care | Payer: Medicaid Other | Attending: Emergency Medicine | Admitting: Emergency Medicine

## 2014-01-12 DIAGNOSIS — K8020 Calculus of gallbladder without cholecystitis without obstruction: Secondary | ICD-10-CM | POA: Insufficient documentation

## 2014-01-12 DIAGNOSIS — K8050 Calculus of bile duct without cholangitis or cholecystitis without obstruction: Secondary | ICD-10-CM

## 2014-01-12 DIAGNOSIS — Z880 Allergy status to penicillin: Secondary | ICD-10-CM | POA: Insufficient documentation

## 2014-01-12 DIAGNOSIS — Z87440 Personal history of urinary (tract) infections: Secondary | ICD-10-CM | POA: Insufficient documentation

## 2014-01-12 DIAGNOSIS — I10 Essential (primary) hypertension: Secondary | ICD-10-CM | POA: Insufficient documentation

## 2014-01-12 DIAGNOSIS — Z8619 Personal history of other infectious and parasitic diseases: Secondary | ICD-10-CM | POA: Insufficient documentation

## 2014-01-12 LAB — URINALYSIS, ROUTINE W REFLEX MICROSCOPIC
GLUCOSE, UA: NEGATIVE mg/dL
HGB URINE DIPSTICK: NEGATIVE
Ketones, ur: NEGATIVE mg/dL
Nitrite: NEGATIVE
PH: 6 (ref 5.0–8.0)
Protein, ur: NEGATIVE mg/dL
Specific Gravity, Urine: 1.012 (ref 1.005–1.030)
UROBILINOGEN UA: 1 mg/dL (ref 0.0–1.0)

## 2014-01-12 LAB — URINE MICROSCOPIC-ADD ON

## 2014-01-12 LAB — PREGNANCY, URINE: PREG TEST UR: NEGATIVE

## 2014-01-12 MED ORDER — RANITIDINE HCL 150 MG PO TABS: 150.0000 mg | ORAL_TABLET | Freq: Two times a day (BID) | ORAL | Status: DC

## 2014-01-12 MED ORDER — HYDROCODONE-ACETAMINOPHEN 5-325 MG PO TABS: 2.0000 | ORAL_TABLET | ORAL | Status: DC | PRN

## 2014-01-12 NOTE — ED Notes (Signed)
Nauseated. States she has gallstones. Cannot get her surgery until the 2nd week in May. Pain right upper outer quadrant. Not eating much.

## 2014-01-12 NOTE — ED Provider Notes (Signed)
CSN: [id number]     Arrival date & time 01/12/14  1730 History  This chart was scribed for Gilda Creasehristopher J. Fionn Stracke, MD by Smiley HousemanFallon Davis, ED Scribe. The patient was seen in room MH06/MH06. Patient's care was started at 7:24 PM.  Chief Complaint  Patient presents with  . Emesis   The history is provided by the patient. No language interpreter was used.   HPI Comments: Tracey Leonard is a 28 y.o. female who presents to the Emergency Department complaining of intermittent right upper abdominal pain with associated nausea that worsened this morning.  Pt reports her appetite has decreased.  She states the pain is worse with certain types of food.  She denies fevers and chills.  Pt states she has gallstones.  She states she is scheduled for surgery the 2nd week of May.  Pt denies taking any medications for acid reflux daily.    Past Medical History  Diagnosis Date  . Infection     UTI  07/15/12  . Infection     07/2012  . Infection     07/2012  . Hypertension   . Pregnancy induced hypertension    Past Surgical History  Procedure Laterality Date  . No past surgeries     Family History  Problem Relation Age of Onset  . Other Father     MVA   History  Substance Use Topics  . Smoking status: Never Smoker   . Smokeless tobacco: Never Used  . Alcohol Use: No   OB History   Grav Para Term Preterm Abortions TAB SAB Ect Mult Living   1 1 1       1      Review of Systems  Constitutional: Negative for fever and chills.  Gastrointestinal: Positive for nausea and abdominal pain. Negative for vomiting and diarrhea.  Musculoskeletal: Negative for back pain.  Skin: Negative for color change and rash.  Neurological: Negative for headaches.  Psychiatric/Behavioral: Negative for behavioral problems and confusion.  All other systems reviewed and are negative.   Allergies  Amoxicillin  Home Medications   Prior to Admission medications   Medication Sig Start Date End Date Taking?  Authorizing Provider  estradiol cypionate (DEPO-ESTRADIOL) 5 MG/ML injection Inject into the muscle every 28 (twenty-eight) days.    Historical Provider, MD   Triage Vitals: BP 135/86  Pulse 90  Temp(Src) 98.4 F (36.9 C) (Oral)  Resp 18  Ht 5\' 4"  (1.626 m)  Wt 167 lb (75.751 kg)  BMI 28.65 kg/m2  SpO2 98%  LMP 12/03/2013  Physical Exam  Constitutional: She is oriented to person, place, and time. She appears well-developed and well-nourished. No distress.  HENT:  Head: Normocephalic and atraumatic.  Right Ear: Hearing normal.  Left Ear: Hearing normal.  Nose: Nose normal.  Mouth/Throat: Oropharynx is clear and moist and mucous membranes are normal.  Eyes: Conjunctivae and EOM are normal. Pupils are equal, round, and reactive to light.  Neck: Normal range of motion. Neck supple.  Cardiovascular: Regular rhythm, S1 normal and S2 normal.  Exam reveals no gallop and no friction rub.   No murmur heard. Pulmonary/Chest: Effort normal and breath sounds normal. No respiratory distress. She exhibits no tenderness.  Abdominal: Soft. Normal appearance and bowel sounds are normal. There is no hepatosplenomegaly. There is no tenderness. There is no rebound, no guarding, no tenderness at McBurney's point and negative Murphy's sign. No hernia.  Musculoskeletal: Normal range of motion.  Neurological: She is alert and oriented to person, place,  and time. She has normal strength. No cranial nerve deficit or sensory deficit. Coordination normal. GCS eye subscore is 4. GCS verbal subscore is 5. GCS motor subscore is 6.  Skin: Skin is warm, dry and intact. No rash noted. No cyanosis.  Psychiatric: She has a normal mood and affect. Her speech is normal and behavior is normal. Thought content normal.    ED Course  Procedures (including critical care time) DIAGNOSTIC STUDIES: Oxygen Saturation is 98% on RA, normal by my interpretation.    COORDINATION OF CARE: 7:52 PM-Will discharge with Zantac and  Vicodin.  Patient informed of current plan of treatment and evaluation and agrees with plan.    Results for orders placed during the hospital encounter of 01/12/14  URINALYSIS, ROUTINE W REFLEX MICROSCOPIC      Result Value Ref Range   Color, Urine AMBER (*) YELLOW   APPearance CLOUDY (*) CLEAR   Specific Gravity, Urine 1.012  1.005 - 1.030   pH 6.0  5.0 - 8.0   Glucose, UA NEGATIVE  NEGATIVE mg/dL   Hgb urine dipstick NEGATIVE  NEGATIVE   Bilirubin Urine SMALL (*) NEGATIVE   Ketones, ur NEGATIVE  NEGATIVE mg/dL   Protein, ur NEGATIVE  NEGATIVE mg/dL   Urobilinogen, UA 1.0  0.0 - 1.0 mg/dL   Nitrite NEGATIVE  NEGATIVE   Leukocytes, UA MODERATE (*) NEGATIVE  PREGNANCY, URINE      Result Value Ref Range   Preg Test, Ur NEGATIVE  NEGATIVE  URINE MICROSCOPIC-ADD ON      Result Value Ref Range   Squamous Epithelial / LPF FEW (*) RARE   WBC, UA 7-10  <3 WBC/hpf   RBC / HPF 0-2  <3 RBC/hpf   Bacteria, UA FEW (*) RARE   Urine-Other AMORPHOUS URATES/PHOSPHATES      MDM   Final diagnoses:  Biliary colic   She has a known history of gallstones. She is having intermittent episodes of abdominal pain which resolved spontaneously. Currently, pain is not present. Abdominal exam is benign. This is most consistent with pruritic colic, but also likely to have some element of reflux. The patient has surgery scheduled. No need for consideration of earlier surgery at this time. Patient given return precautions. Will treat with daily Zantac, Vicodin as needed.  I personally performed the services described in this documentation, which was scribed in my presence. The recorded information has been reviewed and is accurate.       Gilda Creasehristopher J. Rayden Dock, MD 01/12/14 2002

## 2014-01-12 NOTE — Discharge Instructions (Signed)
Biliary Colic  °Biliary colic is a steady or irregular pain in the upper abdomen. It is usually under the right side of the rib cage. It happens when gallstones interfere with the normal flow of bile from the gallbladder. Bile is a liquid that helps to digest fats. Bile is made in the liver and stored in the gallbladder. When you eat a meal, bile passes from the gallbladder through the cystic duct and the common bile duct into the small intestine. There, it mixes with partially digested food. If a gallstone blocks either of these ducts, the normal flow of bile is blocked. The muscle cells in the bile duct contract forcefully to try to move the stone. This causes the pain of biliary colic.  °SYMPTOMS  °· A person with biliary colic usually complains of pain in the upper abdomen. This pain can be: °· In the center of the upper abdomen just below the breastbone. °· In the upper-right part of the abdomen, near the gallbladder and liver. °· Spread back toward the right shoulder blade. °· Nausea and vomiting. °· The pain usually occurs after eating. °· Biliary colic is usually triggered by the digestive system's demand for bile. The demand for bile is high after fatty meals. Symptoms can also occur when a person who has been fasting suddenly eats a very large meal. Most episodes of biliary colic pass after 1 to 5 hours. After the most intense pain passes, your abdomen may continue to ache mildly for about 24 hours. °DIAGNOSIS  °After you describe your symptoms, your caregiver will perform a physical exam. He or she will pay attention to the upper right portion of your belly (abdomen). This is the area of your liver and gallbladder. An ultrasound will help your caregiver look for gallstones. Specialized scans of the gallbladder may also be done. Blood tests may be done, especially if you have fever or if your pain persists. °PREVENTION  °Biliary colic can be prevented by controlling the risk factors for gallstones. Some of  these risk factors, such as heredity, increasing age, and pregnancy are a normal part of life. Obesity and a high-fat diet are risk factors you can change through a healthy lifestyle. Women going through menopause who take hormone replacement therapy (estrogen) are also more likely to develop biliary colic. °TREATMENT  °· Pain medication may be prescribed. °· You may be encouraged to eat a fat-free diet. °· If the first episode of biliary colic is severe, or episodes of colic keep retuning, surgery to remove the gallbladder (cholecystectomy) is usually recommended. This procedure can be done through small incisions using an instrument called a laparoscope. The procedure often requires a brief stay in the hospital. Some people can leave the hospital the same day. It is the most widely used treatment in people troubled by painful gallstones. It is effective and safe, with no complications in more than 90% of cases. °· If surgery cannot be done, medication that dissolves gallstones may be used. This medication is expensive and can take months or years to work. Only small stones will dissolve. °· Rarely, medication to dissolve gallstones is combined with a procedure called shock-wave lithotripsy. This procedure uses carefully aimed shock waves to break up gallstones. In many people treated with this procedure, gallstones form again within a few years. °PROGNOSIS  °If gallstones block your cystic duct or common bile duct, you are at risk for repeated episodes of biliary colic. There is also a 25% chance that you will develop   a gallbladder infection(acute cholecystitis), or some other complication of gallstones within 10 to 20 years. If you have surgery, schedule it at a time that is convenient for you and at a time when you are not sick. °HOME CARE INSTRUCTIONS  °· Drink plenty of clear fluids. °· Avoid fatty, greasy or fried foods, or any foods that make your pain worse. °· Take medications as directed. °SEEK MEDICAL  CARE IF:  °· You develop a fever over 100.5° F (38.1° C). °· Your pain gets worse over time. °· You develop nausea that prevents you from eating and drinking. °· You develop vomiting. °SEEK IMMEDIATE MEDICAL CARE IF:  °· You have continuous or severe belly (abdominal) pain which is not relieved with medications. °· You develop nausea and vomiting which is not relieved with medications. °· You have symptoms of biliary colic and you suddenly develop a fever and shaking chills. This may signal cholecystitis. Call your caregiver immediately. °· You develop a yellow color to your skin or the white part of your eyes (jaundice). °Document Released: 02/05/2006 Document Revised: 11/27/2011 Document Reviewed: 04/16/2008 °[employer]® Patient Information ©2014 [employer]. ° °Cholelithiasis °Cholelithiasis (also called gallstones) is a form of gallbladder disease in which gallstones form in your gallbladder. The gallbladder is an organ that stores bile made in the liver, which helps digest fats. Gallstones begin as small crystals and slowly grow into stones. Gallstone pain occurs when the gallbladder spasms and a gallstone is blocking the duct. Pain can also occur when a stone passes out of the duct.  °RISK FACTORS °· Being female.   °· Having multiple pregnancies. Health care providers sometimes advise removing diseased gallbladders before future pregnancies.   °· Being obese. °· Eating a diet heavy in fried foods and fat.   °· Being older than 60 years and increasing age.   °· Prolonged use of medicines containing female hormones.   °· Having diabetes mellitus.   °· Rapidly losing weight.   °· Having a family history of gallstones (heredity).   °SYMPTOMS °· Nausea.   °· Vomiting. °· Abdominal pain.   °· Yellowing of the skin (jaundice).   °· Sudden pain. It may persist from several minutes to several hours. °· Fever.   °· Tenderness to the touch.  °In some cases, when gallstones do not move into the bile duct, people have no  pain or symptoms. These are called "silent" gallstones.  °TREATMENT °Silent gallstones do not need treatment. In severe cases, emergency surgery may be required. Options for treatment include: °· Surgery to remove the gallbladder. This is the most common treatment. °· Medicines. These do not always work and may take 6 12 months or more to work. °· Shock wave treatment (extracorporeal biliary lithotripsy). In this treatment an ultrasound machine sends shock waves to the gallbladder to break gallstones into smaller pieces that can pass into the intestines or be dissolved by medicine. °HOME CARE INSTRUCTIONS  °· Only take over-the-counter or prescription medicines for pain, discomfort, or fever as directed by your health care provider.   °· Follow a low-fat diet until seen again by your health care provider. Fat causes the gallbladder to contract, which can result in pain.   °· Follow up with your health care provider as directed. Attacks are almost always recurrent and surgery is usually required for permanent treatment.   °SEEK IMMEDIATE MEDICAL CARE IF:  °· Your pain increases and is not controlled by medicines.   °· You have a fever or persistent symptoms for more than 2 3 days.   °· You have a fever and your symptoms   suddenly get worse.   °· You have persistent nausea and vomiting.   °MAKE SURE YOU:  °· Understand these instructions. °· Will watch your condition. °· Will get help right away if you are not doing well or get worse. °Document Released: 08/31/2005 Document Revised: 05/07/2013 Document Reviewed: 02/26/2013 °ExitCare® Patient Information ©2014 [employer]. ° °

## 2014-07-20 ENCOUNTER — Encounter (HOSPITAL_BASED_OUTPATIENT_CLINIC_OR_DEPARTMENT_OTHER): Payer: Self-pay | Admitting: Emergency Medicine

## 2014-10-24 IMAGING — CR DG CHEST 2V
2 series · 2 of 2 positions shown · non-contrast
Comparison: Chest radiograph report April 23, 1994 though images
not available for direct comparison.

CLINICAL DATA: Shortness of breath and epigastric pain.

EXAM:
CHEST  2 VIEW

[w chest pa]
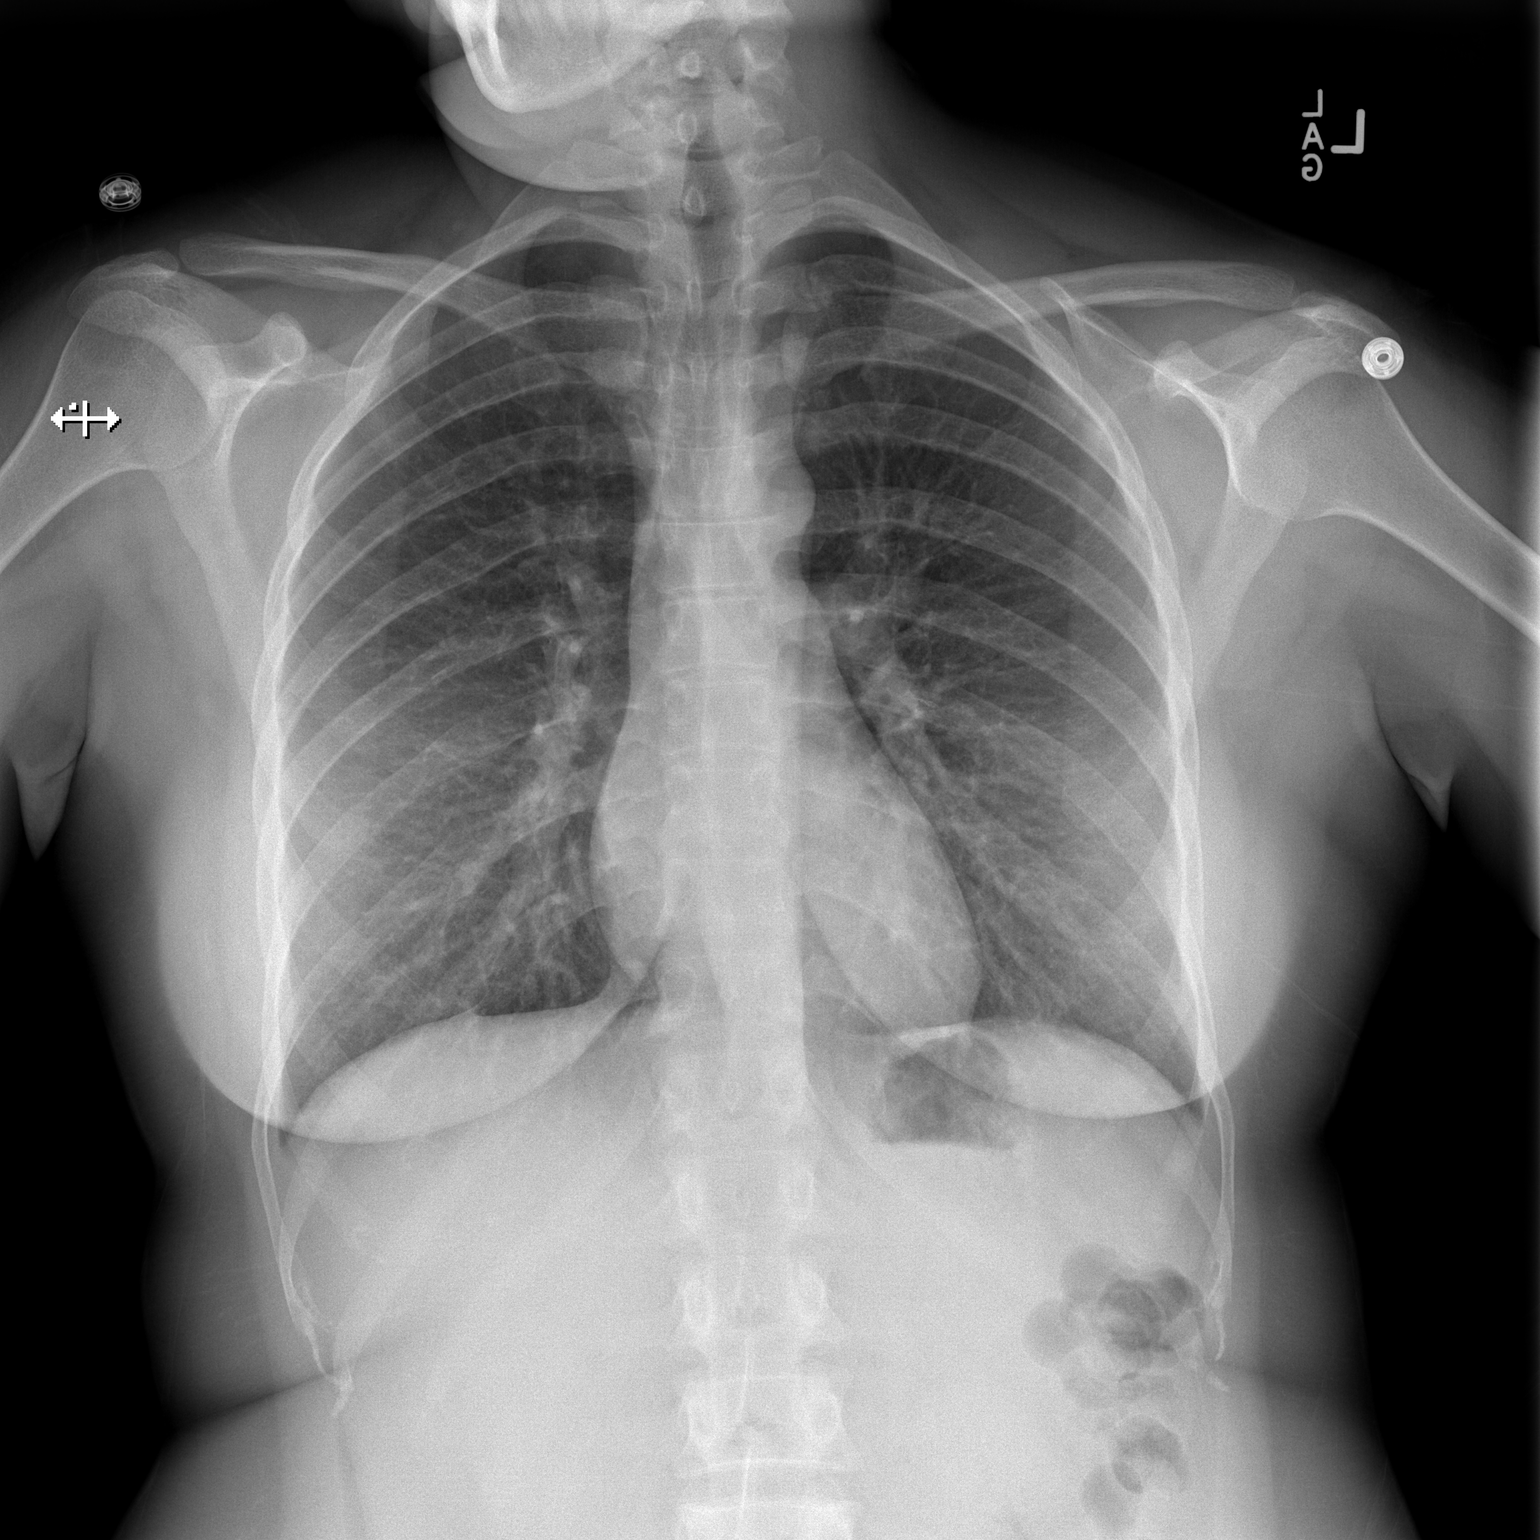

[w chest lat]
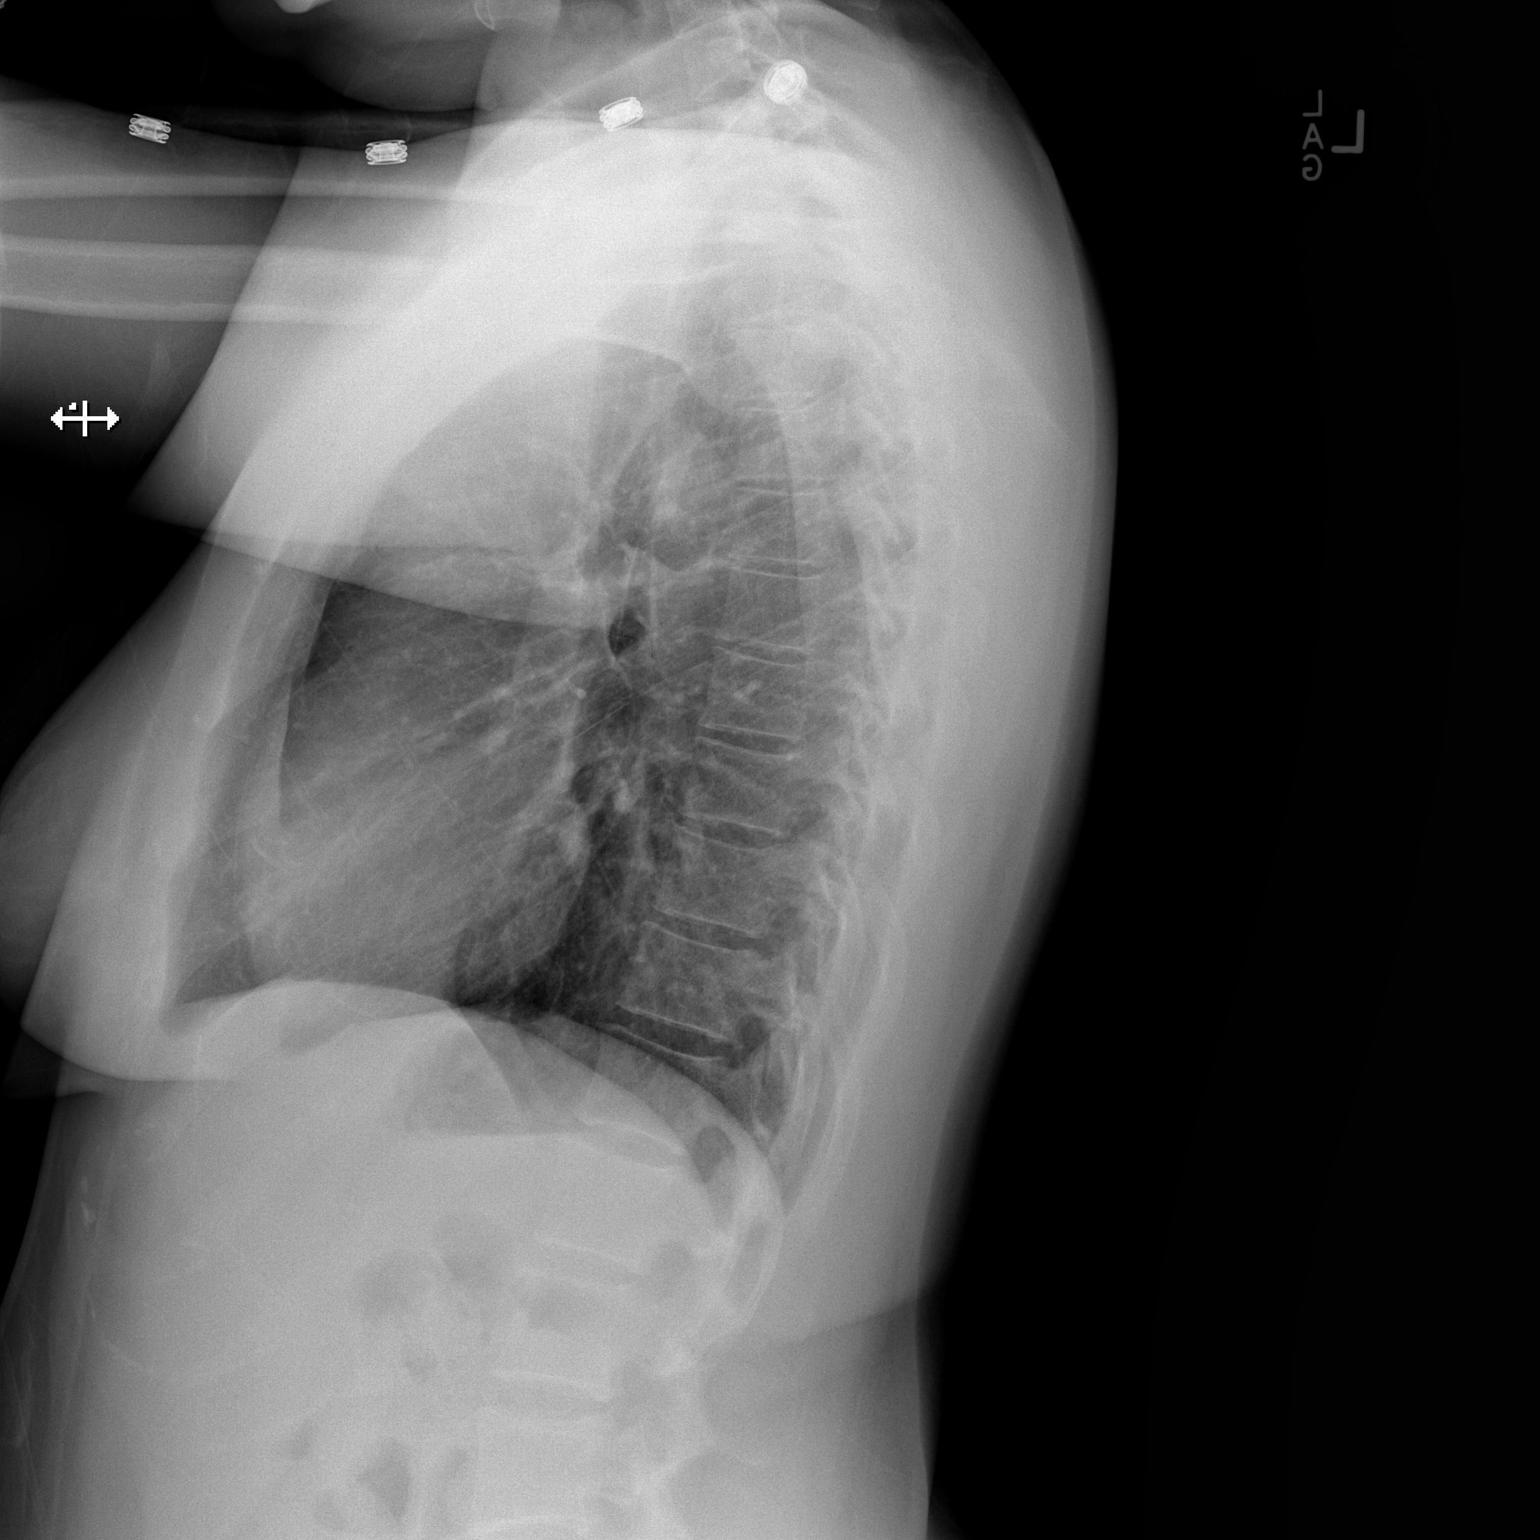

[2 of 2 positions shown; findings below may reference images not displayed]

FINDINGS: Cardiomediastinal silhouette is unremarkable. The lungs are clear
without pleural effusions or focal consolidations. Pulmonary
vasculature is unremarkable. Trachea projects midline and there is
no pneumothorax. Soft tissue planes and included osseous structures
are nonsuspicious.
IMPRESSION: No acute cardiopulmonary process: Normal chest radiograph.

  By: Lkw Tiger

## 2014-10-24 IMAGING — US US ABDOMEN COMPLETE
1 series · 14 of 25 positions shown · non-contrast
Comparison: None.

CLINICAL DATA: Epigastric pain for several months. Increased liver
function tests. Increased white cell count. Postpartum 6 months.

EXAM:
ULTRASOUND ABDOMEN COMPLETE

[Series 1: us abdomen complete · 0.22mm/px · 14 of 83 slices shown]
[im 1/83]
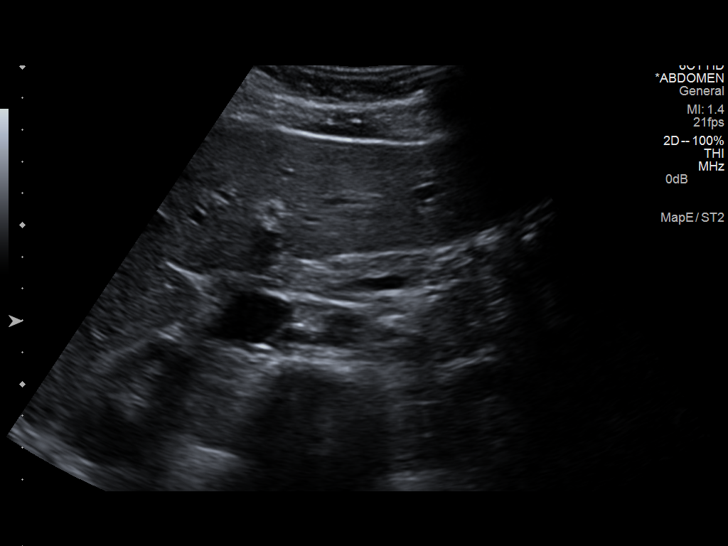
[im 7/83]
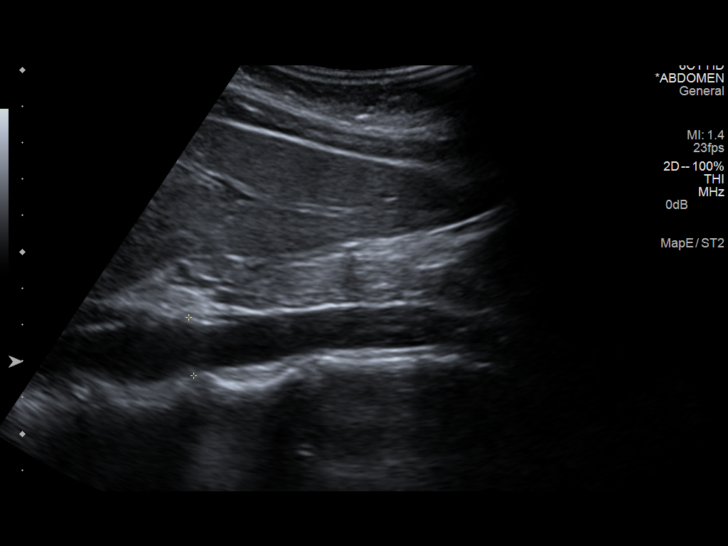
[im 14/83]
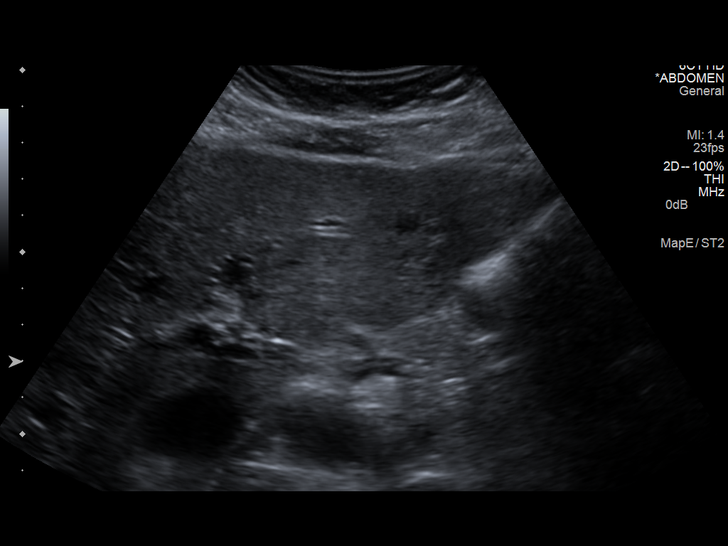
[im 21/83]
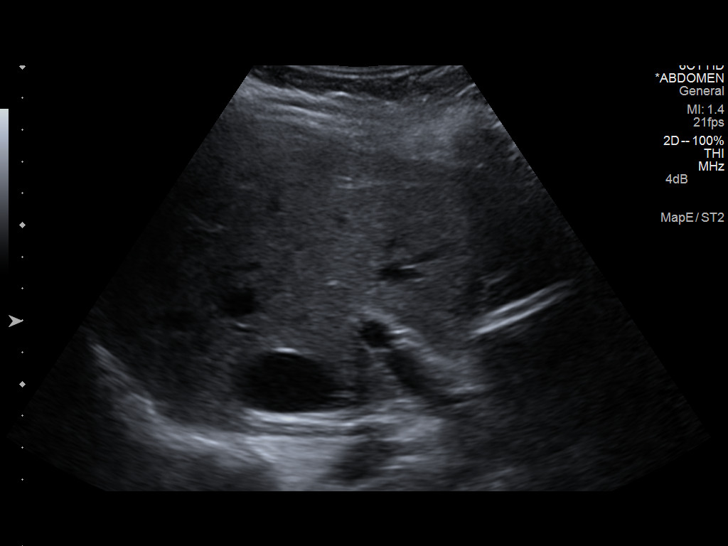
[im 28/83]
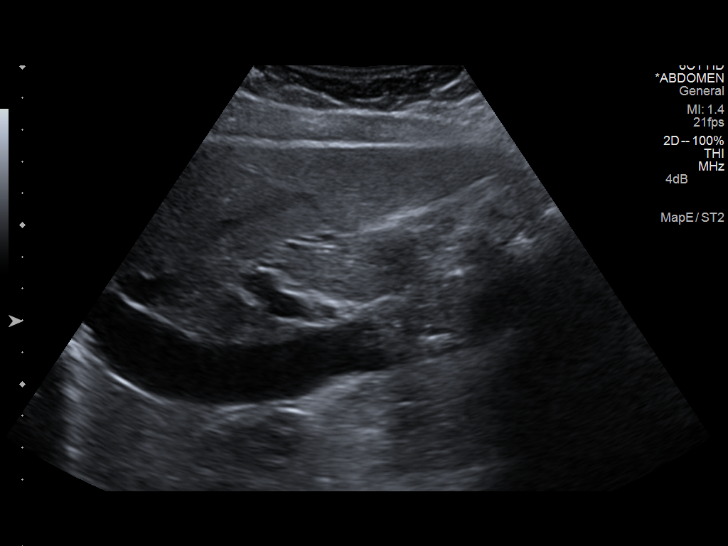
[im 31/83]
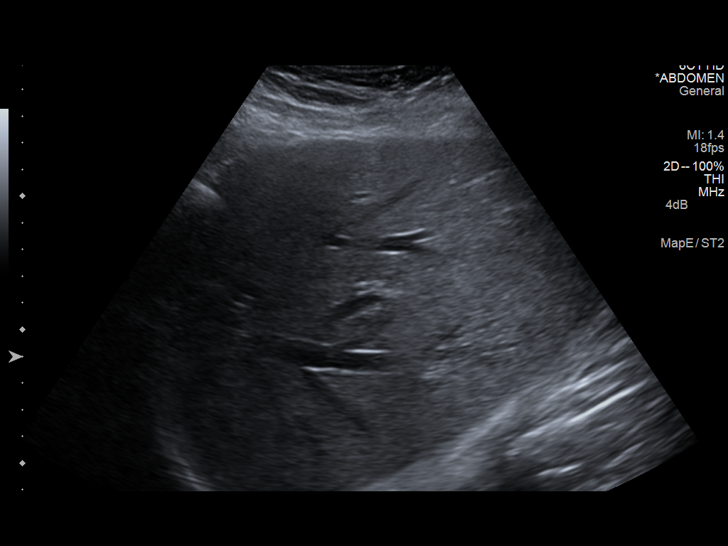
[im 38/83]
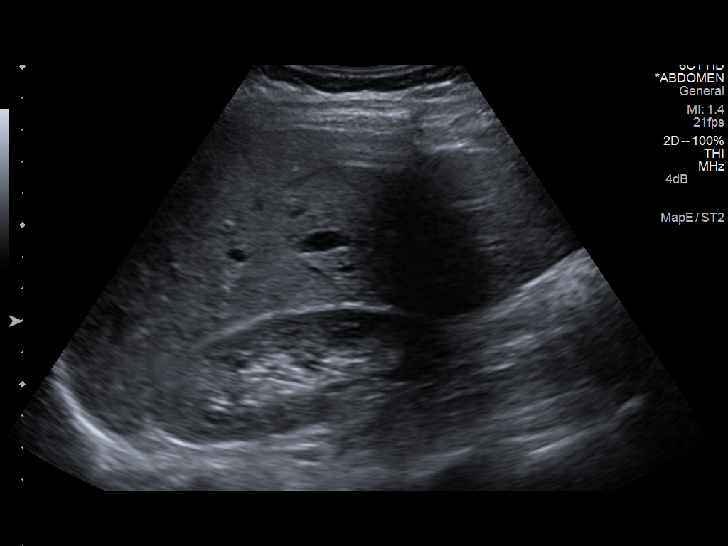
[im 45/83]
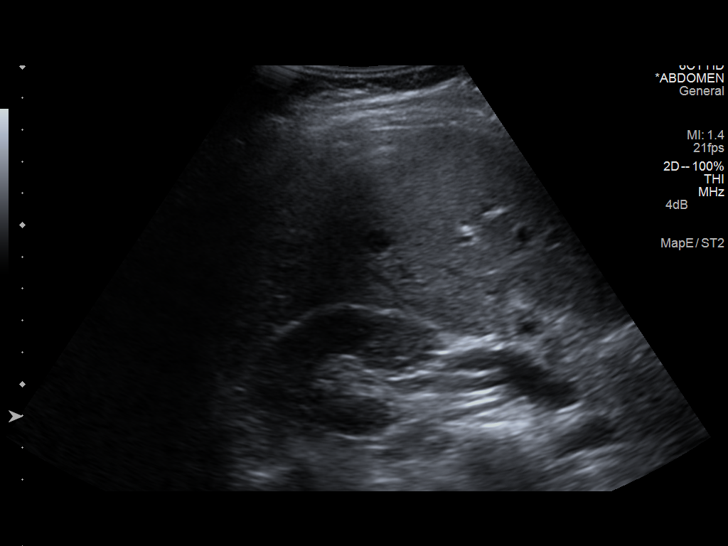
[im 52/83]
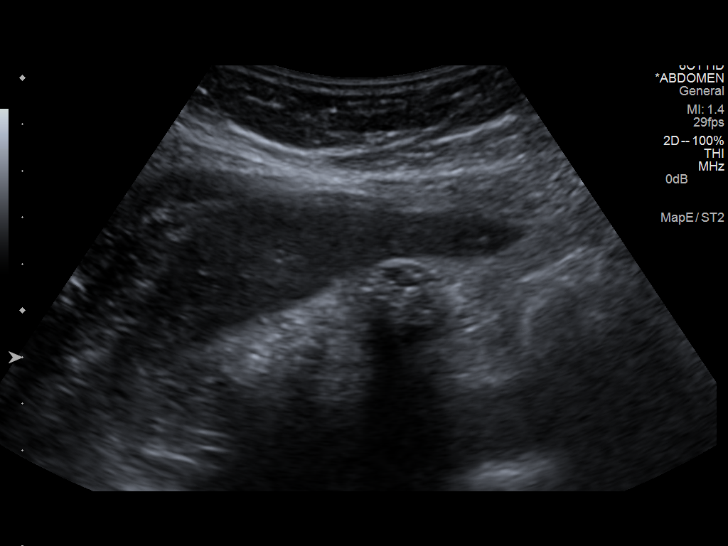
[im 55/83]
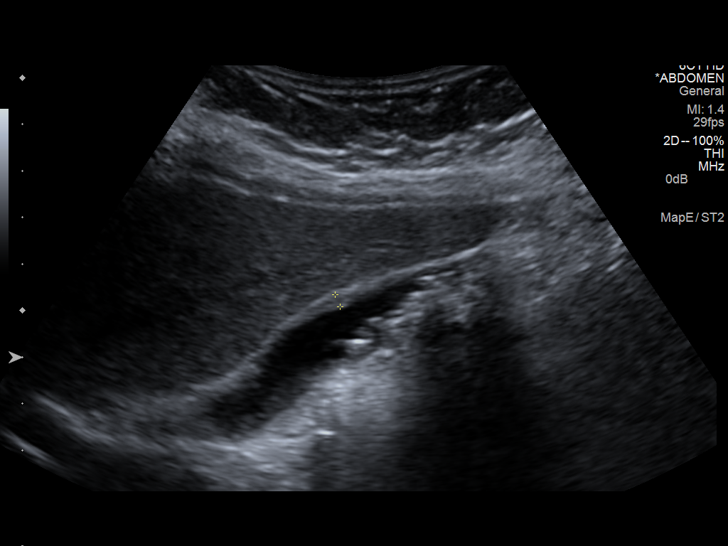
[im 62/83]
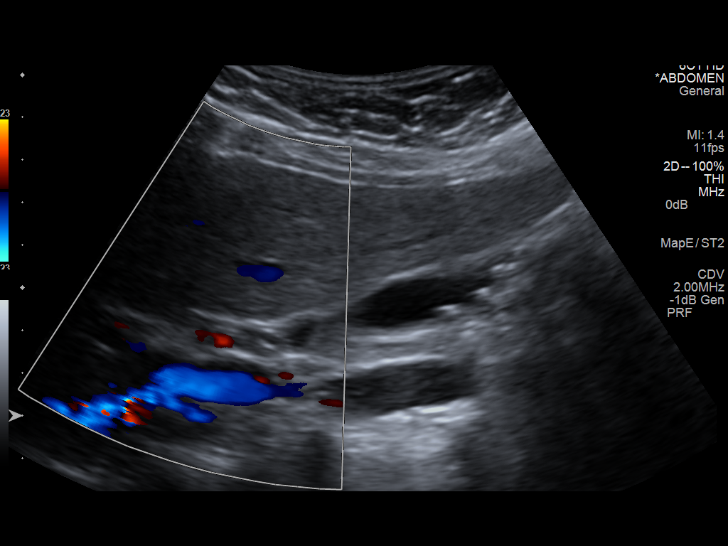
[im 69/83]
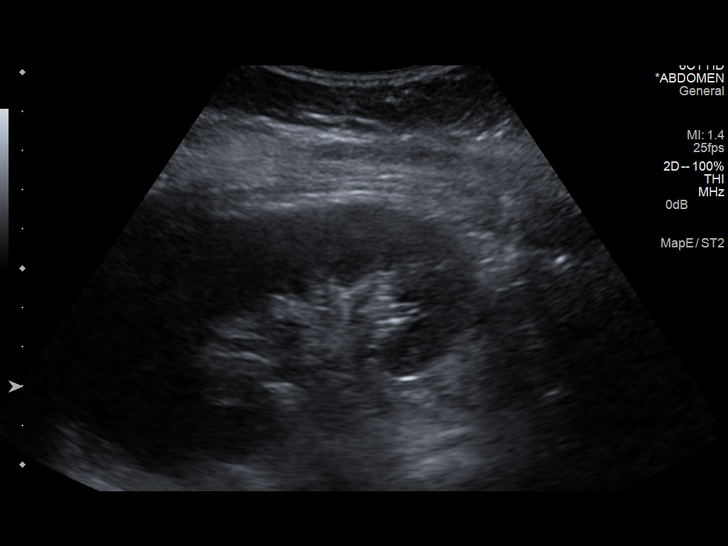
[im 76/83]
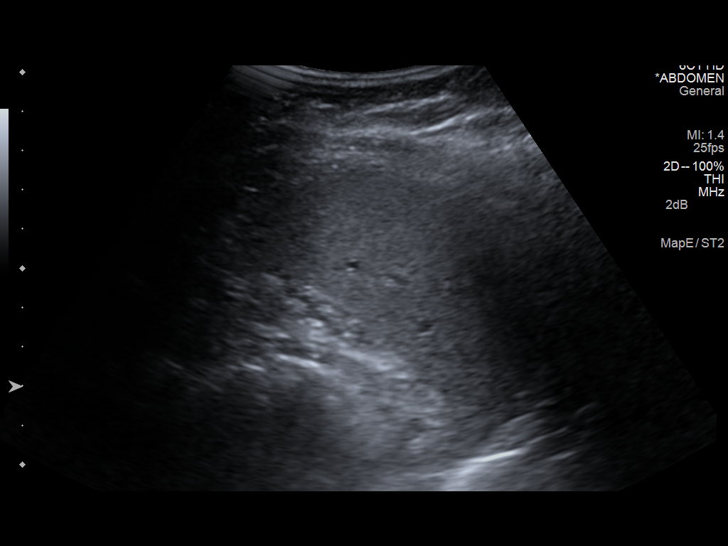
[im 83/83]
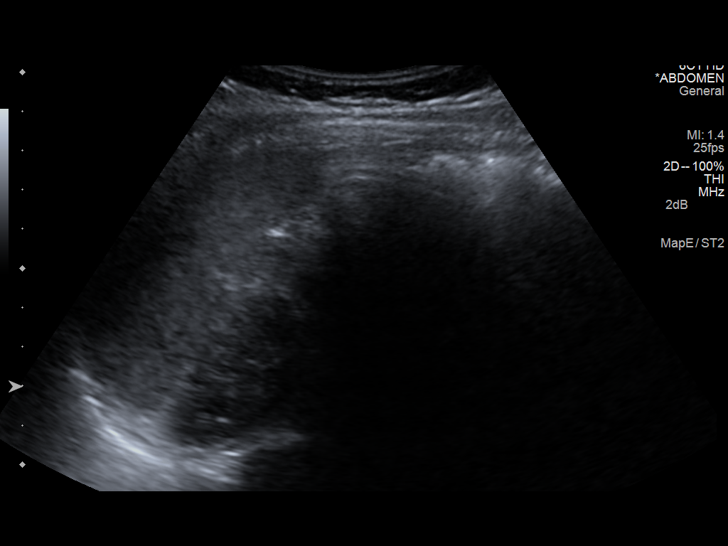

[14 of 25 positions shown; findings below may reference images not displayed]

FINDINGS: Gallbladder:

The gallbladder is contracted with multiple stones. No significant
wall thickening or edema. Murphy's sign is negative.

Common bile duct:

Diameter: 3.4 mm, normal

Liver:

No focal lesion identified. Within normal limits in parenchymal
echogenicity.

IVC:

No abnormality visualized.

Pancreas:

Visualized portion unremarkable.

Spleen:

Size and appearance within normal limits.

Right Kidney:

Length: 10 cm. Echogenicity within normal limits. No mass or
hydronephrosis visualized.

Left Kidney:

Length: 10.9 cm. Echogenicity within normal limits. No mass or
hydronephrosis visualized.

Abdominal aorta:

No aneurysm visualized.

Other findings:

None.
IMPRESSION: Cholelithiasis with contracted gallbladder. No additional
inflammatory changes.

## 2015-03-05 ENCOUNTER — Ambulatory Visit (INDEPENDENT_AMBULATORY_CARE_PROVIDER_SITE_OTHER): Payer: Medicaid Other | Admitting: Certified Nurse Midwife

## 2015-03-05 ENCOUNTER — Encounter: Payer: Self-pay | Admitting: Certified Nurse Midwife

## 2015-03-05 VITALS — BP 126/79 | HR 87 | Ht 64.5 in | Wt 166.0 lb

## 2015-03-05 DIAGNOSIS — N72 Inflammatory disease of cervix uteri: Secondary | ICD-10-CM | POA: Diagnosis not present

## 2015-03-05 DIAGNOSIS — Z01419 Encounter for gynecological examination (general) (routine) without abnormal findings: Secondary | ICD-10-CM

## 2015-03-05 DIAGNOSIS — R011 Cardiac murmur, unspecified: Secondary | ICD-10-CM

## 2015-03-05 DIAGNOSIS — Z0000 Encounter for general adult medical examination without abnormal findings: Secondary | ICD-10-CM | POA: Diagnosis not present

## 2015-03-05 DIAGNOSIS — Z30019 Encounter for initial prescription of contraceptives, unspecified: Secondary | ICD-10-CM

## 2015-03-05 DIAGNOSIS — Z113 Encounter for screening for infections with a predominantly sexual mode of transmission: Secondary | ICD-10-CM

## 2015-03-05 LAB — POCT URINALYSIS DIPSTICK
BILIRUBIN UA: NEGATIVE
Blood, UA: NEGATIVE
Glucose, UA: NEGATIVE
KETONES UA: NEGATIVE
Nitrite, UA: NEGATIVE
PH UA: 5
Spec Grav, UA: 1.02
Urobilinogen, UA: NEGATIVE

## 2015-03-05 LAB — COMPREHENSIVE METABOLIC PANEL
ALBUMIN: 4.4 g/dL (ref 3.5–5.2)
ALT: 9 U/L (ref 0–35)
AST: 15 U/L (ref 0–37)
Alkaline Phosphatase: 80 U/L (ref 39–117)
BILIRUBIN TOTAL: 0.4 mg/dL (ref 0.2–1.2)
BUN: 10 mg/dL (ref 6–23)
CALCIUM: 9.5 mg/dL (ref 8.4–10.5)
CHLORIDE: 101 meq/L (ref 96–112)
CO2: 27 meq/L (ref 19–32)
Creat: 0.81 mg/dL (ref 0.50–1.10)
GLUCOSE: 78 mg/dL (ref 70–99)
POTASSIUM: 4.3 meq/L (ref 3.5–5.3)
SODIUM: 140 meq/L (ref 135–145)
TOTAL PROTEIN: 7.4 g/dL (ref 6.0–8.3)

## 2015-03-05 LAB — TSH: TSH: 0.855 u[IU]/mL (ref 0.350–4.500)

## 2015-03-05 LAB — HEPATITIS C ANTIBODY: HCV Ab: NEGATIVE

## 2015-03-05 LAB — HEPATITIS B SURFACE ANTIGEN: Hepatitis B Surface Ag: NEGATIVE

## 2015-03-05 LAB — POCT URINE PREGNANCY: PREG TEST UR: NEGATIVE

## 2015-03-05 MED ORDER — TINIDAZOLE 500 MG PO TABS: 2.0000 g | ORAL_TABLET | Freq: Every day | ORAL | Status: AC

## 2015-03-05 MED ORDER — FLUCONAZOLE 100 MG PO TABS: 100.0000 mg | ORAL_TABLET | Freq: Once | ORAL | Status: DC

## 2015-03-05 MED ORDER — ETONOGESTREL-ETHINYL ESTRADIOL 0.12-0.015 MG/24HR VA RING: 1.0000 | VAGINAL_RING | VAGINAL | Status: DC

## 2015-03-05 NOTE — Addendum Note (Signed)
Addended by: Marya Landry D on: 03/05/2015 01:31 PM   Modules accepted: Orders

## 2015-03-05 NOTE — Progress Notes (Signed)
Patient ID: Tracey Leonard, female   DOB: 12-09-1985, 29 y.o.   MRN: [id number]    Subjective:     Tracey Leonard is a 29 y.o. female here for a routine exam.  Current complaints: lower left abdominal cramps, denies dysuria, or hematuria.  BMs about every 2 days.  Menses are monthly for about 4-5 days, denies any clots or heavy bleeding. Has not had depo-provera injections for about a year. Currently sexually active.   Desires pregnancy in the next year.  Birth control options discussed.  Denies any family hx of  Blood clots.   MGA had BCA, treated, living. Paternal aunt passed in December from an MI.  Desires STD screening.   Personal health questionnaire:  Is patient Ashkenazi Jewish, have a family history of breast and/or ovarian cancer: yes Is there a family history of uterine cancer diagnosed at age < 61, gastrointestinal cancer, urinary tract cancer, family member who is a Personnel officer syndrome-associated carrier: no Is the patient overweight and hypertensive, family history of diabetes, personal history of gestational diabetes, preeclampsia or PCOS: no Is patient over 37, have PCOS,  family history of premature CHD under age 26, diabetes, smoke, have hypertension or peripheral artery disease:  yes At any time, has a partner hit, kicked or otherwise hurt or frightened you?: no Over the past 2 weeks, have you felt down, depressed or hopeless?: no Over the past 2 weeks, have you felt little interest or pleasure in doing things?:no   Gynecologic History Patient's last menstrual period was 02/17/2015. Contraception: Depo-Provera injections 1 year ago.  Last Pap: unknown. Results were: normal according to the patient Last mammogram: N/A.  Obstetric History OB History  Gravida Para Term Preterm AB SAB TAB Ectopic Multiple Living  # Outcome Date GA Lbr Len/2nd Weight Sex Delivery Anes PTL Lv  1 Term 03/22/13 [redacted]w[redacted]d 04:34 / 00:23 6 lb 2.2 oz (2.784 kg) F Vag-Spont EPI  Y       Past Medical History  Diagnosis Date  . Infection     UTI  07/15/12  . Infection     07/2012  . Infection     07/2012  . Hypertension   . Pregnancy induced hypertension   . GERD (gastroesophageal reflux disease)     Past Surgical History  Procedure Laterality Date  . No past surgeries       Current outpatient prescriptions:  .  ranitidine (ZANTAC) 150 MG tablet, Take 1 tablet (150 mg total) by mouth 2 (two) times daily., Disp: 60 tablet, Rfl: 2 .  estradiol cypionate (DEPO-ESTRADIOL) 5 MG/ML injection, Inject into the muscle every 28 (twenty-eight) days., Disp: , Rfl:  .  etonogestrel-ethinyl estradiol (NUVARING) 0.12-0.015 MG/24HR vaginal ring, Place 1 each vaginally every 21 ( twenty-one) days. Insert one (1) ring vaginally and leave in place for 21 days, then remove for cycle., Disp: 1 each, Rfl: 11 .  fluconazole (DIFLUCAN) 100 MG tablet, Take 1 tablet (100 mg total) by mouth once. Repeat dose in 48-72 hour., Disp: 3 tablet, Rfl: 0 .  HYDROcodone-acetaminophen (NORCO/VICODIN) 5-325 MG per tablet, Take 2 tablets by mouth every 4 (four) hours as needed for moderate pain. (Patient not taking: Reported on 03/05/2015), Disp: 10 tablet, Rfl: 0 .  tinidazole (TINDAMAX) 500 MG tablet, Take 4 tablets (2,000 mg total) by mouth daily with breakfast., Disp: 8 tablet, Rfl: 0 Allergies  Allergen Reactions  . Amoxicillin Itching and  Rash    History  Substance Use Topics  . Smoking status: Never Smoker   . Smokeless tobacco: Never Used  . Alcohol Use: No    Family History  Problem Relation Age of Onset  . Other Father     MVA      Review of Systems  Constitutional: negative for fatigue and weight loss Respiratory: negative for cough and wheezing Cardiovascular: negative for chest pain, fatigue and palpitations Gastrointestinal: negative for abdominal pain and change in bowel habits Musculoskeletal:negative for myalgias Neurological: negative for gait problems and  tremors Behavioral/Psych: negative for abusive relationship, depression Endocrine: negative for temperature intolerance   Genitourinary:negative for abnormal menstrual periods, genital lesions, hot flashes, sexual problems and vaginal discharge, +spotting after period recently Integument/breast: negative for breast lump, breast tenderness, nipple discharge and skin lesion(s)    Objective:       BP 126/79 mmHg  Pulse 87  Ht 5' 4.5" (1.638 m)  Wt 166 lb (75.297 kg)  BMI 28.06 kg/m2  LMP 02/17/2015 General:   alert  Skin:   no rash or abnormalities  Lungs:   clear to auscultation bilaterally  Heart:   regular rate and rhythm, S1, S2 normal, + murmur  Breasts:   normal without suspicious masses, skin or nipple changes or axillary nodes  Abdomen:  normal findings: no organomegaly, soft, non-tender and no hernia  Pelvis:  External genitalia: normal general appearance Urinary system: urethral meatus normal and bladder without fullness, nontender Vaginal: normal without tenderness, induration or masses Cervix: Cervicitis, No CMT Adnexa: normal bimanual exam Uterus: anteverted and non-tender, normal size   Lab Review Urine pregnancy test Labs reviewed yes Radiologic studies reviewed no  50% of 30 min visit spent on counseling and coordination of care.   Assessment:    Healthy female exam.   Cardiac Murmur Cervicitis with BV/yeast Contraception counseling STD screening   Plan:    Education reviewed: depression evaluation, low fat, low cholesterol diet, safe sex/STD prevention, self breast exams, skin cancer screening and weight bearing exercise. Contraception: NuvaRing vaginal inserts. Follow up in: 1 year.   Meds ordered this encounter  Medications  . etonogestrel-ethinyl estradiol (NUVARING) 0.12-0.015 MG/24HR vaginal ring    Sig: Place 1 each vaginally every 21 ( twenty-one) days. Insert one (1) ring vaginally and leave in place for 21 days, then remove for cycle.     Dispense:  1 each    Refill:  11  . tinidazole (TINDAMAX) 500 MG tablet    Sig: Take 4 tablets (2,000 mg total) by mouth daily with breakfast.    Dispense:  8 tablet    Refill:  0  . fluconazole (DIFLUCAN) 100 MG tablet    Sig: Take 1 tablet (100 mg total) by mouth once. Repeat dose in 48-72 hour.    Dispense:  3 tablet    Refill:  0   Orders Placed This Encounter  Procedures  . HIV antibody (with reflex)  . Hepatitis B surface antigen  . RPR  . Hepatitis C antibody  . TSH  . Comprehensive metabolic panel  . Ambulatory referral to Cardiology    Referral Priority:  Routine    Referral Type:  Consultation    Referral Reason:  Specialty Services Required    Requested Specialty:  Cardiology    Number of Visits Requested:  1

## 2015-03-06 LAB — HIV ANTIBODY (ROUTINE TESTING W REFLEX): HIV: NONREACTIVE

## 2015-03-06 LAB — RPR

## 2015-03-08 LAB — PAP IG W/ RFLX HPV ASCU

## 2015-03-09 ENCOUNTER — Other Ambulatory Visit: Payer: Self-pay | Admitting: *Deleted

## 2015-03-09 ENCOUNTER — Other Ambulatory Visit: Payer: Self-pay | Admitting: Certified Nurse Midwife

## 2015-03-09 DIAGNOSIS — N760 Acute vaginitis: Principal | ICD-10-CM

## 2015-03-09 DIAGNOSIS — B9689 Other specified bacterial agents as the cause of diseases classified elsewhere: Secondary | ICD-10-CM

## 2015-03-09 DIAGNOSIS — B379 Candidiasis, unspecified: Secondary | ICD-10-CM

## 2015-03-09 LAB — SURESWAB, VAGINOSIS/VAGINITIS PLUS
ATOPOBIUM VAGINAE: 5.1 Log (cells/mL)
C. ALBICANS, DNA: DETECTED — AB
C. TROPICALIS, DNA: NOT DETECTED
C. glabrata, DNA: NOT DETECTED
C. parapsilosis, DNA: NOT DETECTED
C. trachomatis RNA, TMA: NOT DETECTED
Gardnerella vaginalis: 6.6 Log (cells/mL)
LACTOBACILLUS SPECIES: NOT DETECTED Log (cells/mL)
MEGASPHAERA SPECIES: 6.8 Log (cells/mL)
N. GONORRHOEAE RNA, TMA: NOT DETECTED
T. vaginalis RNA, QL TMA: NOT DETECTED

## 2015-03-09 MED ORDER — TINIDAZOLE 500 MG PO TABS
2.0000 g | ORAL_TABLET | Freq: Every day | ORAL | Status: DC
Start: 2015-03-09 — End: 2015-06-15

## 2015-03-09 MED ORDER — TERCONAZOLE 0.4 % VA CREA: 1.0000 | TOPICAL_CREAM | Freq: Every day | VAGINAL | Status: DC

## 2015-03-16 ENCOUNTER — Telehealth: Payer: Self-pay

## 2015-03-16 NOTE — Telephone Encounter (Signed)
RIED TO GET PATIENT SOONER APPT FOR CARDIOLOGY THEN 8/22, THEY HAD OPENING TOMORROW 03/17/15 8:20AM IN [place] OFF[facility], SHE COULD NOT GO AS HER DAUGHTER HAD SCHOOL - SHE IS ON WAIT LIST

## 2015-04-23 ENCOUNTER — Telehealth: Payer: Self-pay | Admitting: *Deleted

## 2015-04-23 NOTE — Telephone Encounter (Signed)
Patient left a message stating she is interested in changing her birth control.  Attempted to contact the patient and left message for patient to call the office.

## 2015-05-06 ENCOUNTER — Ambulatory Visit: Payer: Medicaid Other | Admitting: Cardiovascular Disease

## 2015-05-10 ENCOUNTER — Encounter: Payer: Medicaid Other | Admitting: Cardiovascular Disease

## 2015-05-10 NOTE — Progress Notes (Signed)
No show

## 2015-05-13 ENCOUNTER — Encounter: Payer: Self-pay | Admitting: Cardiovascular Disease

## 2015-06-14 NOTE — Progress Notes (Signed)
Patient ID: Tracey Leonard, female   DOB: 1985-12-27, 29 y.o.   MRN: [id number]     Cardiology Office Note   Date:  06/15/2015   ID:  Tracey Leonard, DOB 03-27-1986, MRN [id number]  PCP:  Janine Limbo, MD  Cardiologist:   Charlton Haws, MD   No chief complaint on file.     History of Present Illness: Tracey Leonard is a 29 y.o. female who presents for evaluation of murmur  Referred by Dr Moise Boring who heard murmur on routine exam in June of this year No history of rheumatic fever, diet pills or congenital heart disease  ? Borderline BP in past on on Rx for HTN.  Student studying psychology at [employer]  Two yo Daughter LaLania with laryngomalacia No issues with normal vaginal delivery.  Brothers and sisters with no heart issues. No murmur previously described No dyspnea chest pain palpitaitons or syncope    Past Medical History  Diagnosis Date  . Infection     UTI  07/15/12  . Infection     07/2012  . Infection     07/2012  . Hypertension   . Pregnancy induced hypertension   . GERD (gastroesophageal reflux disease)     Past Surgical History  Procedure Laterality Date  . No past surgeries       No current outpatient prescriptions on file.   No current facility-administered medications for this visit.    Allergies:   Amoxicillin    Social History:  The patient  reports that she has never smoked. She has never used smokeless tobacco. She reports that she does not drink alcohol or use illicit drugs.   Family History:  The patient's family history includes Other in her father.    ROS:  Please see the history of present illness.   Otherwise, review of systems are positive for none.   All other systems are reviewed and negative.    PHYSICAL EXAM: VS:  BP 110/70 mmHg  Pulse 86  Ht 5' 3.5" (1.613 m)  Wt 75.66 kg (166 lb 12.8 oz)  BMI 29.08 kg/m2 , BMI Body mass index is 29.08 kg/(m^2). Affect appropriate Healthy:  appears stated age HEENT: normal Neck supple with  no adenopathy JVP normal no bruits no thyromegaly Lungs clear with no wheezing and good diaphragmatic motion Heart:  S1/S2 SEM murmur, no rub, gallop or click PMI normal Abdomen: benighn, BS positve, no tenderness, no AAA no bruit.  No HSM or HJR Distal pulses intact with no bruits No edema Neuro non-focal Skin warm and dry No muscular weakness    EKG:   06/15/15  NSR rate 86    Recent Labs: 03/05/2015: ALT 9; BUN 10; Creat 0.81; Potassium 4.3; Sodium 140; TSH 0.855    Lipid Panel No results found for: CHOL, TRIG, HDL, CHOLHDL, VLDL, LDLCALC, LDLDIRECT    Wt Readings from Last 3 Encounters:  06/15/15 75.66 kg (166 lb 12.8 oz)  03/05/15 75.297 kg (166 lb)  01/12/14 75.751 kg (167 lb)      Other studies Reviewed: Additional studies/ records that were reviewed today include: OB/Gyn notes Dr Stefano Gaul.    ASSESSMENT AND PLAN:  1.  Murmur:  Aortic murmur echo to r/o bicuspid aortic valve 2. BP. Low sodium diet  3. OB/GYN:  F/u Dr Katherina Right [facility]    Current medicines are reviewed at length with the patient today.  The patient does not have concerns regarding medicines.  The following changes have been made:  no change  Labs/ tests ordered today include: Echo  No orders of the defined types were placed in this encounter.     Disposition:   FU with me in a year     Signed, Charlton Haws, MD  06/15/2015 11:52 AM    [facility] [address] [place], [place], [place]  [address] Phone: [phone]; Fax: [phone]

## 2015-06-15 ENCOUNTER — Ambulatory Visit (INDEPENDENT_AMBULATORY_CARE_PROVIDER_SITE_OTHER): Payer: Medicaid Other | Admitting: Cardiovascular Disease

## 2015-06-15 ENCOUNTER — Encounter: Payer: Self-pay | Admitting: Cardiovascular Disease

## 2015-06-15 VITALS — BP 110/70 | HR 86 | Ht 63.5 in | Wt 166.8 lb

## 2015-06-15 DIAGNOSIS — R011 Cardiac murmur, unspecified: Secondary | ICD-10-CM

## 2015-06-15 NOTE — Patient Instructions (Addendum)
Medication Instructions:  NO CHANGES  Labwork: NONE  Testing/Procedures: Your physician has requested that you have an echocardiogram. Echocardiography is a painless test that uses sound waves to create images of your heart. It provides your doctor with information about the size and shape of your heart and how well your heart's chambers and valves are working. This procedure takes approximately one hour. There are no restrictions for this procedure.   Follow-UP  Your physician wants you to follow-up in: YEAR WITH  DR Haywood Filler will receive a reminder letter in the mail two months in advance. If you don't receive a letter, please call our office to schedule the follow-up appointment. Any Other Special Instructions Will Be Listed Below (If Applicable).

## 2015-06-28 NOTE — Telephone Encounter (Signed)
Attempted to contact the patient and left message for patient to call the office.  

## 2015-07-01 ENCOUNTER — Other Ambulatory Visit (HOSPITAL_COMMUNITY): Payer: Medicaid Other

## 2015-07-02 NOTE — Telephone Encounter (Signed)
Patient states she has not been using her current birth control method. Patient states she is late on her cycle and may be pregnant. Patient has been scheduled for a pregnancy test on 10 -17-16.

## 2015-07-23 ENCOUNTER — Other Ambulatory Visit (HOSPITAL_COMMUNITY): Payer: Medicaid Other

## 2016-01-11 ENCOUNTER — Other Ambulatory Visit: Payer: Self-pay | Admitting: Orthopedic Surgery

## 2016-01-17 DIAGNOSIS — M67439 Ganglion, unspecified wrist: Secondary | ICD-10-CM

## 2016-01-17 HISTORY — DX: Ganglion, unspecified wrist: M67.439

## 2016-02-03 ENCOUNTER — Encounter (HOSPITAL_BASED_OUTPATIENT_CLINIC_OR_DEPARTMENT_OTHER): Payer: Self-pay | Admitting: *Deleted

## 2016-02-03 NOTE — Pre-Procedure Instructions (Addendum)
Discussed murmur with Dr. Glade Stanford. Fitzgerald; needs to have murmur evaluated prior to surgery. Message left for Helmut MusterAlicia at Dr. Merrilee SeashoreKuzma's office regarding same.

## 2016-02-04 ENCOUNTER — Encounter (HOSPITAL_BASED_OUTPATIENT_CLINIC_OR_DEPARTMENT_OTHER): Payer: Self-pay | Admitting: *Deleted

## 2016-02-10 ENCOUNTER — Ambulatory Visit (HOSPITAL_BASED_OUTPATIENT_CLINIC_OR_DEPARTMENT_OTHER): Admission: RE | Admit: 2016-02-10 | Payer: Medicaid Other | Source: Ambulatory Visit | Admitting: Orthopedic Surgery

## 2016-02-10 HISTORY — DX: Ganglion, unspecified wrist: M67.439

## 2016-02-10 HISTORY — DX: Cardiac murmur, unspecified: R01.1

## 2016-02-10 SURGERY — EXCISION MASS
Anesthesia: Choice | Site: Wrist | Laterality: Right

## 2016-05-23 ENCOUNTER — Ambulatory Visit (INDEPENDENT_AMBULATORY_CARE_PROVIDER_SITE_OTHER): Payer: Medicaid Other | Admitting: Certified Nurse Midwife

## 2016-06-08 NOTE — Addendum Note (Signed)
Addended by: Virl AxePATE INGALLS, PAMELA L on: 06/08/2016 03:03 PM   Modules accepted: Orders

## 2016-06-08 NOTE — Progress Notes (Deleted)
Patient ID: Tracey Klinefelteronii S Rio, female   DOB: August 07, 1986, 30 y.o.   MRN: [id number]     Cardiology Office Note   Date:  06/08/2016   ID:  Tracey Leonard, DOB August 07, 1986, MRN [id number]  PCP:  Brock BadHARPER,CHARLES A, MD  Cardiologist:   Charlton HawsPeter Nishan, MD   No chief complaint on file.     History of Present Illness: Tracey Klinefelteronii S Chopra is a 30 y.o. female who presents for evaluation of murmur  Referred by Dr Moise Boringenny OB who heard murmur on routine exam in June of this year No history of rheumatic fever, diet pills or congenital heart disease  ? Borderline BP in past on on Rx for HTN.  Student studying psychology at [employer]  3 yo Daughter LaLania with laryngomalacia No issues with normal vaginal delivery.  Brothers and sisters with no heart issues. No murmur previously described No dyspnea chest pain palpitaitons or syncope  Echo: was ordered 06/15/15 but never done   Past Medical History:  Diagnosis Date  . Cardiac murmur    seen by Dr. Charlton HawsPeter Nishan 05/2015; echo ordered, no record of echo having been done  . Dorsal wrist ganglion 01/2016   right    Past Surgical History:  Procedure Laterality Date  . NO PAST SURGERIES       No current outpatient prescriptions on file.   No current facility-administered medications for this visit.     Allergies:   Amoxicillin    Social History:  The patient  reports that she has never smoked. She has never used smokeless tobacco. She reports that she does not drink alcohol or use drugs.   Family History:  The patient's family history includes Other in her father.    ROS:  Please see the history of present illness.   Otherwise, review of systems are positive for none.   All other systems are reviewed and negative.    PHYSICAL EXAM: VS:  LMP 01/20/2016 (Approximate)  , BMI There is no height or weight on file to calculate BMI. Affect appropriate Healthy:  appears stated age HEENT: normal Neck supple with no adenopathy JVP normal no bruits no  thyromegaly Lungs clear with no wheezing and good diaphragmatic motion Heart:  S1/S2 SEM murmur, no rub, gallop or click PMI normal Abdomen: benighn, BS positve, no tenderness, no AAA no bruit.  No HSM or HJR Distal pulses intact with no bruits No edema Neuro non-focal Skin warm and dry No muscular weakness    EKG:   06/15/15  NSR rate 86    Recent Labs: No results found for requested labs within last 8760 hours.    Lipid Panel No results found for: CHOL, TRIG, HDL, CHOLHDL, VLDL, LDLCALC, LDLDIRECT    Wt Readings from Last 3 Encounters:  06/15/15 75.7 kg (166 lb 12.8 oz)  03/05/15 75.3 kg (166 lb)  01/12/14 75.8 kg (167 lb)      Other studies Reviewed: Additional studies/ records that were reviewed today include: OB/Gyn notes Dr Stefano GaulStringer.    ASSESSMENT AND PLAN:  1.  Murmur:  Aortic murmur echo to r/o bicuspid aortic valve 2. BP. Low sodium diet  3. [facility]  [facility]    Current medicines are reviewed at length with the patient today.  The patient does not have concerns regarding medicines.  The following changes have been made:  no change  Labs/ tests ordered today include: Echo  No orders of the defined types were placed in this encounter.  Disposition:   FU with me in a year     Signed, Charlton Haws, MD  06/08/2016 2:04 PM    [facility] [address] [place], [place], [place]  [address] Phone: [phone]; Fax: [phone]

## 2016-06-12 ENCOUNTER — Ambulatory Visit (HOSPITAL_COMMUNITY)
Admission: RE | Admit: 2016-06-12 | Discharge: 2016-06-12 | Disposition: A | Discharge status: Home / Self Care | Payer: Medicaid Other | Source: Ambulatory Visit | Attending: Cardiovascular Disease | Admitting: Cardiovascular Disease

## 2016-06-12 DIAGNOSIS — R011 Cardiac murmur, unspecified: Secondary | ICD-10-CM

## 2016-06-12 NOTE — Progress Notes (Signed)
*  PRELIMINARY RESULTS* Echocardiogram 2D Echocardiogram has been performed.  Tracey Leonard, Tracey Leonard 06/12/2016, 10:41 AM

## 2016-06-15 ENCOUNTER — Ambulatory Visit: Payer: Medicaid Other | Admitting: Cardiovascular Disease

## 2016-07-03 NOTE — Progress Notes (Deleted)
Patient ID: Tracey Leonard, female   DOB: Apr 30, 1986, 30 y.o.   MRN: [id number]     Cardiology Office Note   Date:  07/03/2016   ID:  Tracey Klinefelteronii S Linebaugh, DOB Apr 30, 1986, MRN [id number]  PCP:  Brock BadHARPER,CHARLES A, MD  Cardiologist:   Charlton HawsPeter Samentha Perham, MD   No chief complaint on file.     History of Present Illness: Tracey Klinefelteronii S Messer is a 30 y.o. female seen first in June of 2016 when her OB Dr Katherina Rightenny heard a murmur No history of rheumatic fever, diet pills or congenital heart disease  ? Borderline BP in past on on Rx for HTN.  Student studying psychology [employer]    4yo Daughter LaLania with laryngomalacia No issues with normal vaginal delivery.  Brothers and sisters with no heart issues. No murmur previously described No dyspnea chest pain palpitaitons or syncope  Echo: was ordered 06/15/15 but never done  Echo finally done 06/12/16 and normal with no congenital valve issues and EF 55-60%   Past Medical History:  Diagnosis Date  . Cardiac murmur    seen by Dr. Charlton HawsPeter Lotoya Cas[place] 05/2015; echo ordered, no record of echo having been done  . Dorsal wrist ganglion 01/2016   right    Past Surgical History:  Procedure Laterality Date  . NO PAST SURGERIES       No current outpatient prescriptions on file.   No current facility-administered medications for this visit.     Allergies:   Amoxicillin    Social History:  The patient  reports that she has never smoked. She has never used smokeless tobacco. She reports that she does not drink alcohol or use drugs.   Family History:  The patient's family history includes Other in her father.    ROS:  Please see the history of present illness.   Otherwise, review of systems are positive for none.   All other systems are reviewed and negative.    PHYSICAL EXAM: VS:  LMP 01/20/2016 (Approximate)  , BMI There is no height or weight on file to calculate BMI. Affect appropriate Healthy:  appears stated age HEENT: normal Neck supple with no adenopathy JVP  normal no bruits no thyromegaly Lungs clear with no wheezing and good diaphragmatic motion Heart:  S1/S2 SEM murmur, no rub, gallop or click PMI normal Abdomen: benighn, BS positve, no tenderness, no AAA no bruit.  No HSM or HJR Distal pulses intact with no bruits No edema Neuro non-focal Skin warm and dry No muscular weakness    EKG:   06/15/15  NSR rate 86    Recent Labs: No results found for requested labs within last 8760 hours.    Lipid Panel No results found for: CHOL, TRIG, HDL, CHOLHDL, VLDL, LDLCALC, LDLDIRECT    Wt Readings from Last 3 Encounters:  06/15/15 75.7 kg (166 lb 12.8 oz)  03/05/15 75.3 kg (166 lb)  01/12/14 75.8 kg (167 lb)      Other studies Reviewed: Additional studies/ records that were reviewed today include: OB/Gyn notes Dr Stefano GaulStringer.    ASSESSMENT AND PLAN:  1.  Murmur:  Benign echo ok  2. BP. Low sodium diet  3. OB/GYN:  F/[facility]ather[facility] Rightenny [facility]    Current medicines are reviewed at length with the patient today.  The patient does not have concerns regarding medicines.  The following changes have been made:  no change  Labs/ tests ordered today include: Echo  No orders of the defined types were placed in this  encounter.    Disposition:   FU with me in a year     Signed, Charlton Haws, MD  07/03/2016 1:58 PM    [facility] [address] [place], [place], [place]  [address] Phone: [phone]; Fax: [phone]

## 2016-07-07 ENCOUNTER — Ambulatory Visit: Payer: Medicaid Other | Admitting: Cardiovascular Disease

## 2016-07-10 ENCOUNTER — Encounter: Payer: Self-pay | Admitting: Cardiovascular Disease

## 2016-08-16 ENCOUNTER — Ambulatory Visit: Payer: Medicaid Other | Admitting: Obstetrics

## 2016-10-06 ENCOUNTER — Encounter (HOSPITAL_COMMUNITY): Payer: Self-pay | Admitting: *Deleted

## 2016-10-06 ENCOUNTER — Emergency Department (HOSPITAL_COMMUNITY)
Admission: EM | Admit: 2016-10-06 | Discharge: 2016-10-06 | Disposition: A | Discharge status: Home / Self Care | Payer: Medicaid Other | Attending: Emergency Medicine | Admitting: Emergency Medicine

## 2016-10-06 DIAGNOSIS — Y92410 Unspecified street and highway as the place of occurrence of the external cause: Secondary | ICD-10-CM | POA: Diagnosis not present

## 2016-10-06 DIAGNOSIS — R51 Headache: Secondary | ICD-10-CM | POA: Diagnosis present

## 2016-10-06 DIAGNOSIS — G44209 Tension-type headache, unspecified, not intractable: Secondary | ICD-10-CM | POA: Insufficient documentation

## 2016-10-06 DIAGNOSIS — Y939 Activity, unspecified: Secondary | ICD-10-CM | POA: Insufficient documentation

## 2016-10-06 DIAGNOSIS — Y999 Unspecified external cause status: Secondary | ICD-10-CM | POA: Insufficient documentation

## 2016-10-06 HISTORY — DX: Headache: R51

## 2016-10-06 HISTORY — DX: Other chronic pain: G89.29

## 2016-10-06 MED ORDER — ACETAMINOPHEN 325 MG PO TABS
650.0000 mg | ORAL_TABLET | Freq: Once | ORAL | Status: AC
  Administered 2016-10-06: 650 mg via ORAL
  Filled 2016-10-06: qty 2

## 2016-10-06 NOTE — ED Triage Notes (Signed)
Pt states headache since yesterday afternoon that was somewhat relieved with goody powder.  She would take aleve, but she hates swallowing pills.  States some photophobia; denies nausea.
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
## 2016-10-18 ENCOUNTER — Encounter (HOSPITAL_COMMUNITY): Payer: Self-pay | Admitting: Emergency Medicine

## 2016-10-18 ENCOUNTER — Emergency Department (HOSPITAL_COMMUNITY)
Admission: EM | Admit: 2016-10-18 | Discharge: 2016-10-18 | Disposition: A | Discharge status: Home / Self Care | Payer: Medicaid Other | Attending: Emergency Medicine | Admitting: Emergency Medicine

## 2016-10-18 DIAGNOSIS — R1033 Periumbilical pain: Secondary | ICD-10-CM | POA: Diagnosis present

## 2016-10-18 DIAGNOSIS — R110 Nausea: Secondary | ICD-10-CM

## 2016-10-18 DIAGNOSIS — R1084 Generalized abdominal pain: Secondary | ICD-10-CM

## 2016-10-18 LAB — COMPREHENSIVE METABOLIC PANEL
ALBUMIN: 3.8 g/dL (ref 3.5–5.0)
ALK PHOS: 64 U/L (ref 38–126)
ALT: 16 U/L (ref 14–54)
ANION GAP: 6 (ref 5–15)
AST: 25 U/L (ref 15–41)
BILIRUBIN TOTAL: 0.7 mg/dL (ref 0.3–1.2)
BUN: <5 mg/dL — ABNORMAL LOW (ref 6–20)
CALCIUM: 8.9 mg/dL (ref 8.9–10.3)
CO2: 25 mmol/L (ref 22–32)
Chloride: 104 mmol/L (ref 101–111)
Creatinine, Ser: 0.91 mg/dL (ref 0.44–1.00)
GLUCOSE: 78 mg/dL (ref 65–99)
Potassium: 3.1 mmol/L — ABNORMAL LOW (ref 3.5–5.1)
Sodium: 135 mmol/L (ref 135–145)
TOTAL PROTEIN: 6.8 g/dL (ref 6.5–8.1)

## 2016-10-18 LAB — CBC WITH DIFFERENTIAL/PLATELET
BASOS PCT: 1 %
Basophils Absolute: 0 10*3/uL (ref 0.0–0.1)
EOS ABS: 0 10*3/uL (ref 0.0–0.7)
Eosinophils Relative: 1 %
HCT: 36.3 % (ref 36.0–46.0)
HEMOGLOBIN: 11.5 g/dL — AB (ref 12.0–15.0)
Lymphocytes Relative: 34 %
Lymphs Abs: 1.4 10*3/uL (ref 0.7–4.0)
MCH: 25.3 pg — ABNORMAL LOW (ref 26.0–34.0)
MCHC: 31.7 g/dL (ref 30.0–36.0)
MCV: 79.8 fL (ref 78.0–100.0)
MONOS PCT: 7 %
Monocytes Absolute: 0.3 10*3/uL (ref 0.1–1.0)
NEUTROS PCT: 57 %
Neutro Abs: 2.4 10*3/uL (ref 1.7–7.7)
Platelets: 239 10*3/uL (ref 150–400)
RBC: 4.55 MIL/uL (ref 3.87–5.11)
RDW: 15.2 % (ref 11.5–15.5)
WBC: 4.2 10*3/uL (ref 4.0–10.5)

## 2016-10-18 LAB — I-STAT BETA HCG BLOOD, ED (MC, WL, AP ONLY)

## 2016-10-18 LAB — LIPASE, BLOOD: Lipase: 17 U/L (ref 11–51)

## 2016-10-18 MED ORDER — DICYCLOMINE HCL 20 MG PO TABS: 20.0000 mg | ORAL_TABLET | Freq: Two times a day (BID) | ORAL | 0 refills | Status: DC

## 2016-10-18 MED ORDER — ONDANSETRON 4 MG PO TBDP
4.0000 mg | ORAL_TABLET | Freq: Once | ORAL | Status: AC
  Administered 2016-10-18: 4 mg via ORAL
  Filled 2016-10-18: qty 1

## 2016-10-18 MED ORDER — ONDANSETRON HCL 4 MG PO TABS: 4.0000 mg | ORAL_TABLET | Freq: Four times a day (QID) | ORAL | 0 refills | Status: DC

## 2016-10-18 NOTE — Discharge Instructions (Signed)

## 2016-10-18 NOTE — ED Provider Notes (Signed)
Emergency Department Provider Note   I have reviewed the triage vital signs and the nursing notes.   HISTORY  Chief Complaint Abdominal Pain   HPI Tracey Leonard is a 31 y.o. female with PMH of heart murmur and chronic HA presents to the ED for evaluation of nausea and periumbilical abdominal pain. Symptoms have been ongoing for 2-3 days. Symptoms worse with eating. No alleviating factors. No radiation of pain. Pain is a "soreness" and constant. No vaginal bleeding or discharge. No vomiting or diarrhea.    Past Medical History:  Diagnosis Date  . Cardiac murmur    seen by Dr. Charlton Haws 05/2015; echo ordered, no record of echo having been done  . Chronic headaches   . Dorsal wrist ganglion 01/2016   right    Patient Active Problem List   Diagnosis Date Noted  . Chronic cholecystitis with calculus 10/13/2013  . Gallstones 09/26/2013  . Vaginal delivery 03/23/2013  . Second-degree perineal laceration, with delivery 03/23/2013  . Active labor at term 03/22/2013  . Marginal insertion of umbilical cord - EFW q4wks starting at 24wks 12/02/2012  . Pregnant state, incidental 08/28/2012  . 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 First trimester bleeding 08/07/2012  . Vaginal discharge 08/07/2012    Past Surgical History:  Procedure Laterality Date  . NO PAST SURGERIES      Current Outpatient Rx  . Order #: [id number] Class: Historical Med  . Order #: [id number] Class: Historical Med  . Order #: [id number] Class: Print  . Order #: [id number] Class: Print    Allergies Amoxicillin  Family History  Problem Relation Age of Onset  . Other Father     MVA    Social History Social History  Substance Use Topics  . Smoking status: Never Smoker  . Smokeless tobacco: Never Used  . Alcohol use No    Review of Systems  Constitutional: No fever/chills Eyes: No visual changes. ENT: No sore throat. Cardiovascular: Denies chest pain. Respiratory: Denies shortness  of breath. Gastrointestinal: Positive periumbilical abdominal pain. Positive nausea, no vomiting.  No diarrhea.  No constipation. Genitourinary: Negative for dysuria. Musculoskeletal: Negative for back pain. Skin: Negative for rash. Neurological: Negative for headaches, focal weakness or numbness.  10-point ROS otherwise negative.  ____________________________________________   PHYSICAL EXAM:  VITAL SIGNS: ED Triage Vitals  Enc Vitals Group     BP 10/18/16 1000 125/87     Pulse Rate 10/18/16 1000 86     Resp 10/18/16 1000 16     Temp 10/18/16 1000 98 F (36.7 C)     Temp src --      SpO2 10/18/16 1000 99 %     Pain Score 10/18/16 0957 9   Constitutional: Alert and oriented. Well appearing and in no acute distress. Eyes: Conjunctivae are normal.  Head: Atraumatic. Nose: No congestion/rhinnorhea. Mouth/Throat: Mucous membranes are moist.  Oropharynx non-erythematous. Neck: No stridor.  Cardiovascular: Normal rate, regular rhythm. Good peripheral circulation. Grossly normal heart sounds.   Respiratory: Normal respiratory effort.  No retractions. Lungs CTAB. Gastrointestinal: Soft and nontender. No distention.  Musculoskeletal: No lower extremity tenderness nor edema. No gross deformities of extremities. Neurologic:  Normal speech and language. No gross focal neurologic deficits are appreciated.  Skin:  Skin is warm, dry and intact. No rash noted. Psychiatric: Mood and affect are normal. Speech and behavior are normal.  ____________________________________________   LABS (all labs ordered are listed, but only abnormal results are displayed)  Labs Reviewed  COMPREHENSIVE METABOLIC PANEL - Abnormal;  Notable for the following:       Result Value   Potassium 3.1 (*)    BUN <5 (*)    All other components within normal limits  CBC WITH DIFFERENTIAL/PLATELET - Abnormal; Notable for the following:    Hemoglobin 11.5 (*)    MCH 25.3 (*)    All other components within normal  limits  LIPASE, BLOOD  I-STAT BETA HCG BLOOD, ED ([facility], [facility], [facility] ONLY)   ____________________________________________  RADIOLOGY  None ____________________________________________   PROCEDURES  Procedure(s) performed:   Procedures  None ____________________________________________   INITIAL IMPRESSION / ASSESSMENT AND PLAN / ED COURSE  Pertinent labs & imaging results that were available during my care of the patient were reviewed by me and considered in my medical decision making (see chart for details).  Patient presents to the ED with periumbilical abdominal pain and nausea. No vomiting or diarrhea. No GU symptoms. Labs are unremarkable. Patient given Zofran and PO fluids. Symptoms resolved with unchanged, normal abdominal exam. Plan for supportive care at home and PCP f/u PRN.   At this time, I do not feel there is any life-threatening condition present. I have reviewed and discussed all results (EKG, imaging, lab, urine as appropriate), exam findings with patient. I have reviewed nursing notes and appropriate previous records.  I feel the patient is safe to be discharged home without further emergent workup. Discussed usual and customary return precautions. Patient and family (if present) verbalize understanding and are comfortable with this plan.  Patient will follow-up with their primary care provider. If they do not have a primary care provider, information for follow-up has been provided to them. All questions have been answered.  ____________________________________________  FINAL CLINICAL IMPRESSION(S) / ED DIAGNOSES  Final diagnoses:  Generalized abdominal pain  Nausea     MEDICATIONS GIVEN DURING THIS VISIT:  Medications  ondansetron (ZOFRAN-ODT) disintegrating tablet 4 mg (4 mg Oral Given 10/18/16 1057)     NEW OUTPATIENT MEDICATIONS STARTED DURING THIS VISIT:  Discharge Medication List as of 10/18/2016 12:23 PM    START taking these medications   Details    dicyclomine (BENTYL) 20 MG tablet Take 1 tablet (20 mg total) by mouth 2 (two) times daily., Starting Wed 10/18/2016, Print    ondansetron (ZOFRAN) 4 MG tablet Take 1 tablet (4 mg total) by mouth every 6 (six) hours., Starting Wed 10/18/2016, Print          Note:  This document was prepared using Dragon voice recognition software and may include unintentional dictation errors.  Alona BeneJoshua Long, MD Emergency Medicine   Maia PlanJoshua G Long, MD 10/18/16 [phone]
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
## 2016-11-16 ENCOUNTER — Ambulatory Visit (INDEPENDENT_AMBULATORY_CARE_PROVIDER_SITE_OTHER): Payer: Medicaid Other | Admitting: Obstetrics

## 2016-11-16 ENCOUNTER — Encounter: Payer: Self-pay | Admitting: Obstetrics

## 2016-11-16 ENCOUNTER — Other Ambulatory Visit (HOSPITAL_COMMUNITY)
Admission: RE | Admit: 2016-11-16 | Discharge: 2016-11-16 | Disposition: A | Discharge status: Home / Self Care | Payer: Medicaid Other | Source: Ambulatory Visit | Attending: Obstetrics | Admitting: Obstetrics

## 2016-11-16 VITALS — BP 125/84 | HR 88 | Ht 64.0 in | Wt 164.2 lb

## 2016-11-16 DIAGNOSIS — Z113 Encounter for screening for infections with a predominantly sexual mode of transmission: Secondary | ICD-10-CM | POA: Diagnosis present

## 2016-11-16 DIAGNOSIS — Z0000 Encounter for general adult medical examination without abnormal findings: Secondary | ICD-10-CM | POA: Diagnosis not present

## 2016-11-16 DIAGNOSIS — Z01419 Encounter for gynecological examination (general) (routine) without abnormal findings: Secondary | ICD-10-CM

## 2016-11-16 DIAGNOSIS — Z3009 Encounter for other general counseling and advice on contraception: Secondary | ICD-10-CM

## 2016-11-16 NOTE — Progress Notes (Signed)
Pt presents for annual, pap, and all STD testing. Pt c/o heavy white vaginal discharge with odor no itching.

## 2016-11-16 NOTE — Progress Notes (Signed)
Subjective:        Tracey Leonard is a 31 y.o. female here for a routine exam.  Current complaints: Vaginal discharge with odor.    Personal health questionnaire:  Is patient Ashkenazi Jewish, have a family history of breast and/or ovarian cancer: no Is there a family history of uterine cancer diagnosed at age < 31, gastrointestinal cancer, urinary tract cancer, family member who is a Personnel officer syndrome-associated carrier: no Is the patient overweight and hypertensive, family history of diabetes, personal history of gestational diabetes, preeclampsia or PCOS: no Is patient over 54, have PCOS,  family history of premature CHD under age 22, diabetes, smoke, have hypertension or peripheral artery disease:  no At any time, has a partner hit, kicked or otherwise hurt or frightened you?: no Over the past 2 weeks, have you felt down, depressed or hopeless?: no Over the past 2 weeks, have you felt little interest or pleasure in doing things?:no   Gynecologic History Patient's last menstrual period was 10/21/2016. Contraception: none Last Pap: 2016. Results were: normal Last mammogram: n/a. Results were: n/a  Obstetric History OB History  Gravida Para Term Preterm AB Living  1 1 1     1   SAB TAB Ectopic Multiple Live Births          1    # Outcome Date GA Lbr Len/2nd Weight Sex Delivery Anes PTL Lv  1 Term 03/22/13 [redacted]w[redacted]d 04:34 / 00:23 6 lb 2.2 oz (2.784 kg) F Vag-Spont EPI  LIV      Past Medical History:  Diagnosis Date  . Cardiac murmur    seen by Dr. Charlton Haws 05/2015; echo ordered, no record of echo having been done. Next appt 11/22/16  . Chronic headaches   . Dorsal wrist ganglion 01/2016   right  . Low blood potassium     Past Surgical History:  Procedure Laterality Date  . NO PAST SURGERIES       Current Outpatient Prescriptions:  Marland Kitchen  Multiple Vitamins-Minerals (MULTIPLE VITAMINS/WOMENS PO), Take by mouth., Disp: , Rfl:  .  acetaminophen (TYLENOL) 325 MG tablet, Take 650  mg by mouth every 6 (six) hours as needed for mild pain., Disp: , Rfl:  .  bismuth subsalicylate (PEPTO BISMOL) 262 MG/15ML suspension, Take 30 mLs by mouth every 6 (six) hours as needed for indigestion., Disp: , Rfl:  .  dicyclomine (BENTYL) 20 MG tablet, Take 1 tablet (20 mg total) by mouth 2 (two) times daily. (Patient not taking: Reported on 11/16/2016), Disp: 20 tablet, Rfl: 0 .  ondansetron (ZOFRAN) 4 MG tablet, Take 1 tablet (4 mg total) by mouth every 6 (six) hours. (Patient not taking: Reported on 11/16/2016), Disp: 12 tablet, Rfl: 0 Allergies  Allergen Reactions  . Amoxicillin Rash    Social History  Substance Use Topics  . Smoking status: Never Smoker  . Smokeless tobacco: Never Used  . Alcohol use No    Family History  Problem Relation Age of Onset  . Other Father     MVA      Review of Systems  Constitutional: negative for fatigue and weight loss Respiratory: negative for cough and wheezing Cardiovascular: negative for chest pain, fatigue and palpitations Gastrointestinal: negative for abdominal pain and change in bowel habits Musculoskeletal:negative for myalgias Neurological: negative for gait problems and tremors Behavioral/Psych: negative for abusive relationship, depression Endocrine: negative for temperature intolerance    Genitourinary:negative for abnormal menstrual periods, genital lesions, hot flashes, sexual problems and vaginal discharge Integument/breast:  negative for breast lump, breast tenderness, nipple discharge and skin lesion(s)    Objective:       BP 125/84   Pulse 88   Ht 5\' 4"  (1.626 m)   Wt 164 lb 3.2 oz (74.5 kg)   LMP 10/21/2016   BMI 28.18 kg/m  General:   alert  Skin:   no rash or abnormalities  Lungs:   clear to auscultation bilaterally  Heart:   regular rate and rhythm, S1, S2 normal, no murmur, click, rub or gallop  Breasts:   normal without suspicious masses, skin or nipple changes or axillary nodes  Abdomen:  normal findings:  no organomegaly, soft, non-tender and no hernia  Pelvis:  External genitalia: normal general appearance Urinary system: urethral meatus normal and bladder without fullness, nontender Vaginal: normal without tenderness, induration or masses Cervix: normal appearance Adnexa: normal bimanual exam Uterus: anteverted and non-tender, normal size   Lab Review Urine pregnancy test Labs reviewed yes Radiologic studies reviewed no  50% of 20 min visit spent on counseling and coordination of care.    Assessment:    Healthy female exam.    Contraceptive counseling and Advice.  Declines contraception.   Plan:    Wet prep and cultures done  Education reviewed: low fat, low cholesterol diet, safe sex/STD prevention, self breast exams and weight bearing exercise. Contraception: none. Follow up in: 1 year.   Meds ordered this encounter  Medications  . Multiple Vitamins-Minerals (MULTIPLE VITAMINS/WOMENS PO)    Sig: Take by mouth.   Orders Placed This Encounter  Procedures  . Hepatitis B surface antigen  . Hepatitis C antibody  . HIV antibody  . RPR

## 2016-11-17 LAB — HEPATITIS C ANTIBODY: Hep C Virus Ab: <0.1 s/co ratio (ref 0.0–0.9)

## 2016-11-17 LAB — CERVICOVAGINAL ANCILLARY ONLY
BACTERIAL VAGINITIS: POSITIVE — AB
CANDIDA VAGINITIS: POSITIVE — AB
Chlamydia: POSITIVE — AB
Neisseria Gonorrhea: NEGATIVE
Trichomonas: NEGATIVE

## 2016-11-17 LAB — HIV ANTIBODY (ROUTINE TESTING W REFLEX): HIV SCREEN 4TH GENERATION: NONREACTIVE

## 2016-11-17 LAB — HEPATITIS B SURFACE ANTIGEN: HEP B S AG: NEGATIVE

## 2016-11-17 LAB — RPR: RPR: NONREACTIVE

## 2016-11-17 LAB — CYTOLOGY - PAP: DIAGNOSIS: NEGATIVE

## 2016-11-17 NOTE — Progress Notes (Deleted)
Patient ID: Tracey Leonard, female   DOB: 1986/05/10, 31 y.o.   MRN: [id number]     Cardiology Office Note   Date:  11/17/2016   ID:  Tracey Leonard, DOB 1986/05/10, MRN [id number]  PCP:  Brock BadHARPER,CHARLES A, MD  Cardiologist:   Charlton HawsPeter Jalayiah Bibian, MD   No chief complaint on file.     History of Present Illness: Tracey Leonard is a 31 y.o. female first seen September 2016 for  evaluation of murmur  Referred by Dr Moise Boringenny OB who heard murmur on routine exam   No history of rheumatic fever, diet pills or congenital heart disease  ? Borderline BP in past on on Rx for HTN.  Student studying psychology at [employer]  4 yo Daughter LaLania with laryngomalacia No issues with normal vaginal delivery.  Brothers and sisters with no heart issues. No murmur previously described No dyspnea chest pain palpitaitons or syncope  Echo Reviewed :  06/12/16 Normal no significant valve disease identified    Past Medical History:  Diagnosis Date  . Cardiac murmur    seen by Dr. Charlton HawsPeter Reeva Davern 05/2015; echo ordered, no record of echo having been done. Next appt 11/22/16  . Chronic headaches   . Dorsal wrist ganglion 01/2016   right  . Low blood potassium     Past Surgical History:  Procedure Laterality Date  . NO PAST SURGERIES       Current Outpatient Prescriptions  Medication Sig Dispense Refill  . acetaminophen (TYLENOL) 325 MG tablet Take 650 mg by mouth every 6 (six) hours as needed for mild pain.    Marland Kitchen. bismuth subsalicylate (PEPTO BISMOL) 262 MG/15ML suspension Take 30 mLs by mouth every 6 (six) hours as needed for indigestion.    . dicyclomine (BENTYL) 20 MG tablet Take 1 tablet (20 mg total) by mouth 2 (two) times daily. (Patient not taking: Reported on 11/16/2016) 20 tablet 0  . Multiple Vitamins-Minerals (MULTIPLE VITAMINS/WOMENS PO) Take by mouth.    . ondansetron (ZOFRAN) 4 MG tablet Take 1 tablet (4 mg total) by mouth every 6 (six) hours. (Patient not taking: Reported on 11/16/2016) 12 tablet 0   No current  facility-administered medications for this visit.     Allergies:   Amoxicillin    Social History:  The patient  reports that she has never smoked. She has never used smokeless tobacco. She reports that she does not drink alcohol or use drugs.   Family History:  The patient's family history includes Other in her father.    ROS:  Please see the history of present illness.   Otherwise, review of systems are positive for none.   All other systems are reviewed and negative.    PHYSICAL EXAM: VS:  There were no vitals taken for this visit. , BMI There is no height or weight on file to calculate BMI. Affect appropriate Healthy:  appears stated age HEENT: normal Neck supple with no adenopathy JVP normal no bruits no thyromegaly Lungs clear with no wheezing and good diaphragmatic motion Heart:  S1/S2 SEM murmur, no rub, gallop or click PMI normal Abdomen: benighn, BS positve, no tenderness, no AAA no bruit.  No HSM or HJR Distal pulses intact with no bruits No edema Neuro non-focal Skin warm and dry No muscular weakness    EKG:   06/15/15  NSR rate 86    Recent Labs: 10/18/2016: ALT 16; BUN <5; Creatinine, Ser 0.91; Hemoglobin 11.5; Platelets 239; Potassium 3.1; Sodium 135    Lipid  Panel No results found for: CHOL, TRIG, HDL, CHOLHDL, VLDL, LDLCALC, LDLDIRECT    Wt Readings from Last 3 Encounters:  11/16/16 164 lb 3.2 oz (74.5 kg)  10/06/16 161 lb (73 kg)  06/15/15 166 lb 12.8 oz (75.7 kg)      Other studies Reviewed: Additional studies/ records that were reviewed today include: OB/Gyn notes Dr Stefano Gaul.    ASSESSMENT AND PLAN:  1.  Murmur:  Aortic murmur echo with no sub valvular, valvular or supravalvular lesions No need for SBE 2. BP. Low sodium diet  3. OB/GYN:  F/u Dr Katherina [facility]    Current medicines are reviewed at length with the patient today.  The patient does not have concerns regarding medicines.  The following changes have been made:  no change  Labs/  tests ordered today include: Echo  No orders of the defined types were placed in this encounter.    Disposition:   FU with me in a year     Signed, Charlton Haws, MD  11/17/2016 4:03 PM    [facility] [address] [place], [place], [place]  [address] Phone: [phone]; Fax: [phone]

## 2016-11-18 ENCOUNTER — Other Ambulatory Visit: Payer: Self-pay | Admitting: Obstetrics

## 2016-11-18 DIAGNOSIS — A749 Chlamydial infection, unspecified: Secondary | ICD-10-CM

## 2016-11-18 DIAGNOSIS — B379 Candidiasis, unspecified: Secondary | ICD-10-CM

## 2016-11-18 DIAGNOSIS — B9689 Other specified bacterial agents as the cause of diseases classified elsewhere: Secondary | ICD-10-CM

## 2016-11-18 DIAGNOSIS — N760 Acute vaginitis: Secondary | ICD-10-CM

## 2016-11-18 MED ORDER — AZITHROMYCIN 500 MG PO TABS: 1000.0000 mg | ORAL_TABLET | Freq: Once | ORAL | 0 refills | Status: AC

## 2016-11-18 MED ORDER — TINIDAZOLE 500 MG PO TABS: 1000.0000 mg | ORAL_TABLET | Freq: Every day | ORAL | 2 refills | Status: DC

## 2016-11-18 MED ORDER — FLUCONAZOLE 150 MG PO TABS: 150.0000 mg | ORAL_TABLET | Freq: Once | ORAL | 0 refills | Status: AC

## 2016-11-18 MED ORDER — CEFIXIME 400 MG PO TABS: 400.0000 mg | ORAL_TABLET | Freq: Once | ORAL | 0 refills | Status: AC

## 2016-11-21 ENCOUNTER — Telehealth: Payer: Self-pay | Admitting: Obstetrics

## 2016-11-21 DIAGNOSIS — A749 Chlamydial infection, unspecified: Secondary | ICD-10-CM

## 2016-11-21 MED ORDER — AZITHROMYCIN 200 MG/5ML PO SUSR: 1000.0000 mg | Freq: Once | ORAL | 0 refills | Status: AC

## 2016-11-21 NOTE — Telephone Encounter (Signed)
Patient called back requesting Rx be sent as liquid,  Per pharmacy they will not crush pills for patient.

## 2016-11-21 NOTE — Addendum Note (Signed)
Addended by: Pennie BanterSMITH, Hannie Shoe W on: 11/21/2016 02:34 PM   Modules accepted: Orders

## 2016-11-21 NOTE — Telephone Encounter (Signed)
Patient called because pharmacy called her to notify her she had prescriptions ready to pick up, but states that no one from the office has called her yet.  Verified SS#.  Notified patient of positive CT, BV and yeast and need to pick up RX's.  Patient is requesting that pills be in powder form as she has trouble swallowing pills.  Instructed patient to have pharmacy prepare them for her in this form.  Patient is also requesting a cream for her yeast treatment versus a pill.  Will route to provider for alternate treatment for yeast.  STD report card faxed to health department.

## 2016-11-22 ENCOUNTER — Telehealth: Payer: Self-pay

## 2016-11-22 ENCOUNTER — Ambulatory Visit: Payer: Medicaid Other | Admitting: Cardiovascular Disease

## 2016-11-22 DIAGNOSIS — B379 Candidiasis, unspecified: Secondary | ICD-10-CM

## 2016-11-22 MED ORDER — TERCONAZOLE 0.4 % VA CREA: 1.0000 | TOPICAL_CREAM | Freq: Every day | VAGINAL | 0 refills | Status: DC

## 2016-11-22 NOTE — Telephone Encounter (Signed)
error 

## 2016-11-22 NOTE — Telephone Encounter (Signed)
Patient called in and stated that she was told yesterday that a cream would be sent in instead of Diflucan pill, advised would send with provider approval.

## 2016-12-04 NOTE — Progress Notes (Deleted)
Patient ID: Tracey Leonard, female   DOB: 03-26-1986, 31 y.o.   MRN: [id number]     Cardiology Office Note   Date:  12/04/2016   ID:  Tracey Leonard, DOB 07/24/1986, MRN [id number]  PCP:  Brock Bad, MD  Cardiologist:   Charlton Haws, MD   No chief complaint on file.     History of Present Illness: Tracey Leonard is a 31 y.o. female first seen September 2016 for  evaluation of murmur  Referred by Dr Moise Boring who heard murmur on routine exam   No history of rheumatic fever, diet pills or congenital heart disease  ? Borderline BP in past on on Rx for HTN.  Student studying psychology at [employer]  5 yo Daughter LaLania with laryngomalacia No issues with normal vaginal delivery.  Brothers and sisters with no heart issues. No murmur previously described No dyspnea chest pain palpitaitons or syncope  Echo Reviewed :  06/12/16 Normal no significant valve disease identified    Past Medical History:  Diagnosis Date  . Cardiac murmur    seen by Dr. Charlton Haws 05/2015; echo ordered, no record of echo having been done. Next appt 11/22/16  . Chronic headaches   . Dorsal wrist ganglion 01/2016   right  . Low blood potassium     Past Surgical History:  Procedure Laterality Date  . NO PAST SURGERIES       Current Outpatient Prescriptions  Medication Sig Dispense Refill  . acetaminophen (TYLENOL) 325 MG tablet Take 650 mg by mouth every 6 (six) hours as needed for mild pain.    Marland Kitchen bismuth subsalicylate (PEPTO BISMOL) 262 MG/15ML suspension Take 30 mLs by mouth every 6 (six) hours as needed for indigestion.    . dicyclomine (BENTYL) 20 MG tablet Take 1 tablet (20 mg total) by mouth 2 (two) times daily. (Patient not taking: Reported on 11/16/2016) 20 tablet 0  . Multiple Vitamins-Minerals (MULTIPLE VITAMINS/WOMENS PO) Take by mouth.    . ondansetron (ZOFRAN) 4 MG tablet Take 1 tablet (4 mg total) by mouth every 6 (six) hours. (Patient not taking: Reported on 11/16/2016) 12 tablet 0  . terconazole  (TERAZOL 7) 0.4 % vaginal cream Place 1 applicator vaginally at bedtime. 45 g 0  . tinidazole (TINDAMAX) 500 MG tablet Take 2 tablets (1,000 mg total) by mouth daily with breakfast. 10 tablet 2   No current facility-administered medications for this visit.     Allergies:   Amoxicillin    Social History:  The patient  reports that she has never smoked. She has never used smokeless tobacco. She reports that she does not drink alcohol or use drugs.   Family History:  The patient's family history includes Other in her father.    ROS:  Please see the history of present illness.   Otherwise, review of systems are positive for none.   All other systems are reviewed and negative.    PHYSICAL EXAM: VS:  There were no vitals taken for this visit. , BMI There is no height or weight on file to calculate BMI. Affect appropriate Healthy:  appears stated age HEENT: normal Neck supple with no adenopathy JVP normal no bruits no thyromegaly Lungs clear with no wheezing and good diaphragmatic motion Heart:  S1/S2 SEM murmur, no rub, gallop or click PMI normal Abdomen: benighn, BS positve, no tenderness, no AAA no bruit.  No HSM or HJR Distal pulses intact with no bruits No edema Neuro non-focal Skin warm and dry  No muscular weakness    EKG:   06/15/15  NSR rate 86    Recent Labs: 10/18/2016: ALT 16; BUN <5; Creatinine, Ser 0.91; Hemoglobin 11.5; Platelets 239; Potassium 3.1; Sodium 135    Lipid Panel No results found for: CHOL, TRIG, HDL, CHOLHDL, VLDL, LDLCALC, LDLDIRECT    Wt Readings from Last 3 Encounters:  11/16/16 164 lb 3.2 oz (74.5 kg)  10/06/16 161 lb (73 kg)  06/15/15 166 lb 12.8 oz (75.7 kg)      Other studies Reviewed: Additional studies/ records that were reviewed today include: OB/Gyn notes Dr Stefano GaulStringer.    ASSESSMENT AND PLAN:  1.  Murmur:  Aortic murmur echo with no sub valvular, valvular or supravalvular lesions No need for SBE 2. BP. Low sodium diet  3.  [facility]:  [facility]    Current medicines are reviewed at length with the patient today.  The patient does not have concerns regarding medicines.  The following changes have been made:  no change  Labs/ tests ordered today include: Echo  No orders of the defined types were placed in this encounter.    Disposition:   FU with me in a year     Signed, Charlton HawsPeter Jackee Glasner, MD  12/04/2016 1:24 PM    [facility] [address], [place], [place]  [address] Phone: [phone]; Fax: [phone]

## 2016-12-07 ENCOUNTER — Ambulatory Visit: Payer: Medicaid Other | Admitting: Cardiovascular Disease

## 2016-12-27 ENCOUNTER — Other Ambulatory Visit (HOSPITAL_COMMUNITY)
Admission: RE | Admit: 2016-12-27 | Discharge: 2016-12-27 | Disposition: A | Discharge status: Home / Self Care | Payer: Medicaid Other | Source: Ambulatory Visit | Attending: Obstetrics | Admitting: Obstetrics

## 2016-12-27 ENCOUNTER — Ambulatory Visit (INDEPENDENT_AMBULATORY_CARE_PROVIDER_SITE_OTHER): Payer: Medicaid Other | Admitting: Obstetrics

## 2016-12-27 ENCOUNTER — Encounter: Payer: Self-pay | Admitting: Obstetrics

## 2016-12-27 VITALS — BP 119/78 | HR 81 | Wt 162.9 lb

## 2016-12-27 DIAGNOSIS — B3731 Acute candidiasis of vulva and vagina: Secondary | ICD-10-CM

## 2016-12-27 DIAGNOSIS — B373 Candidiasis of vulva and vagina: Secondary | ICD-10-CM | POA: Insufficient documentation

## 2016-12-27 DIAGNOSIS — B9689 Other specified bacterial agents as the cause of diseases classified elsewhere: Secondary | ICD-10-CM

## 2016-12-27 DIAGNOSIS — N760 Acute vaginitis: Secondary | ICD-10-CM

## 2016-12-27 DIAGNOSIS — N898 Other specified noninflammatory disorders of vagina: Secondary | ICD-10-CM | POA: Diagnosis not present

## 2016-12-27 DIAGNOSIS — A749 Chlamydial infection, unspecified: Secondary | ICD-10-CM | POA: Diagnosis not present

## 2016-12-27 NOTE — Progress Notes (Signed)
Patient ID: Tracey Leonard, female   DOB: 1985-11-17, 31 y.o.   MRN: [id number]  Chief Complaint  Patient presents with  . Follow-up    TOC    HPI Tracey Leonard is a 31 y.o. female.  History of positive Chlamydia / BV and Candida, treated.  Presents for TOC cultures and wet prep. HPI  Past Medical History:  Diagnosis Date  . Cardiac murmur    seen by Dr. Charlton Haws 05/2015; echo ordered, no record of echo having been done. Next appt 11/22/16  . Chronic headaches   . Dorsal wrist ganglion 01/2016   right  . Low blood potassium     Past Surgical History:  Procedure Laterality Date  . NO PAST SURGERIES      Family History  Problem Relation Age of Onset  . Other Father     MVA    Social History Social History  Substance Use Topics  . Smoking status: Never Smoker  . Smokeless tobacco: Never Used  . Alcohol use No    Allergies  Allergen Reactions  . Amoxicillin Rash    Current Outpatient Prescriptions  Medication Sig Dispense Refill  . acetaminophen (TYLENOL) 325 MG tablet Take 650 mg by mouth every 6 (six) hours as needed for mild pain.    Marland Kitchen bismuth subsalicylate (PEPTO BISMOL) 262 MG/15ML suspension Take 30 mLs by mouth every 6 (six) hours as needed for indigestion.    . dicyclomine (BENTYL) 20 MG tablet Take 1 tablet (20 mg total) by mouth 2 (two) times daily. (Patient not taking: Reported on 11/16/2016) 20 tablet 0  . Multiple Vitamins-Minerals (MULTIPLE VITAMINS/WOMENS PO) Take by mouth.    . ondansetron (ZOFRAN) 4 MG tablet Take 1 tablet (4 mg total) by mouth every 6 (six) hours. (Patient not taking: Reported on 11/16/2016) 12 tablet 0  . terconazole (TERAZOL 7) 0.4 % vaginal cream Place 1 applicator vaginally at bedtime. 45 g 0  . tinidazole (TINDAMAX) 500 MG tablet Take 2 tablets (1,000 mg total) by mouth daily with breakfast. 10 tablet 2   No current facility-administered medications for this visit.     Review of Systems Review of Systems Constitutional:  negative for fatigue and weight loss Respiratory: negative for cough and wheezing Cardiovascular: negative for chest pain, fatigue and palpitations Gastrointestinal: negative for abdominal pain and change in bowel habits Genitourinary:negative Integument/breast: negative for nipple discharge Musculoskeletal:negative for myalgias Neurological: negative for gait problems and tremors Behavioral/Psych: negative for abusive relationship, depression Endocrine: negative for temperature intolerance      Blood pressure 119/78, pulse 81, weight 162 lb 14.4 oz (73.9 kg), last menstrual period 12/17/2016.  Physical Exam Physical Exam           General:  Alert and no distress Abdomen:  normal findings: no organomegaly, soft, non-tender and no hernia  Pelvis:  External genitalia: normal general appearance Urinary system: urethral meatus normal and bladder without fullness, nontender Vaginal: normal without tenderness, induration or masses Cervix: normal appearance Adnexa: normal bimanual exam Uterus: anteverted and non-tender, normal size    50% of 15 min visit spent on counseling and coordination of care.    Data Reviewed Wet prep and cultures  Assessment     History of positive Chlamydia / BV and Candida, treated    Plan    TOC cultures and wet prep repeated F/U prn  No orders of the defined types were placed in this encounter.  No orders of the defined types were placed in this  encounter.

## 2016-12-27 NOTE — Progress Notes (Signed)
Patient presents for TOC.- (Positive CT, BV, Yeast).

## 2016-12-28 LAB — CERVICOVAGINAL ANCILLARY ONLY
Bacterial vaginitis: POSITIVE — AB
CHLAMYDIA, DNA PROBE: NEGATIVE
Candida vaginitis: POSITIVE — AB
Neisseria Gonorrhea: NEGATIVE
TRICH (WINDOWPATH): NEGATIVE

## 2016-12-29 ENCOUNTER — Other Ambulatory Visit: Payer: Self-pay | Admitting: Obstetrics

## 2016-12-29 NOTE — Progress Notes (Signed)
Patient ID: Tracey Leonard, female   DOB: [birth year], 31 y.o.   MRN: [id number]     Cardiology Office Note   Date:  01/03/2017   ID:  Tracey Leonard, DOB Nov 06, 1985, MRN [id number]  PCP:  Brock Bad, MD  Cardiologist:   Charlton Haws, MD   Chief Complaint  Patient presents with  . Heart Murmur  . Follow-up      History of Present Illness: Tracey Leonard is a 31 y.o. female first seen September 2016 for  evaluation of murmur  Referred by Dr Moise Boring who heard murmur on routine exam   No history of rheumatic fever, diet pills or congenital heart disease  ? Borderline BP in past on on Rx for HTN.  Student studying psychology at [employer]  7 yo Daughter LaLania with laryngomalacia No issues with normal vaginal delivery.  Brothers and sisters with no heart issues. No murmur previously described No dyspnea chest pain palpitaitons or syncope  Echo Reviewed :  06/12/16 Normal no significant valve disease identified   Daughter is 4 now and doing well She is working part time at [employer]  Past Medical History:  Diagnosis Date  . Cardiac murmur    seen by Dr. Charlton Haws 05/2015; echo ordered, no record of echo having been done. Next appt 11/22/16  . Chronic headaches   . Dorsal wrist ganglion 01/2016   right  . Low blood potassium     Past Surgical History:  Procedure Laterality Date  . NO PAST SURGERIES       Current Outpatient Prescriptions  Medication Sig Dispense Refill  . famotidine (PEPCID) 20 MG tablet Take 20 mg by mouth daily.    . Multiple Vitamins-Minerals (MULTIPLE VITAMINS/WOMENS PO) Take by mouth.     No current facility-administered medications for this visit.     Allergies:   Amoxicillin    Social History:  The patient  reports that she has never smoked. She has never used smokeless tobacco. She reports that she does not drink alcohol or use drugs.   Family History:  The patient's family history includes Other in her father.    ROS:  Please see the history  of present illness.   Otherwise, review of systems are positive for none.   All other systems are reviewed and negative.    PHYSICAL EXAM: VS:  LMP 12/17/2016 (Exact Date)  , BMI There is no height or weight on file to calculate BMI. Affect appropriate Healthy:  appears stated age HEENT: normal Neck supple with no adenopathy JVP normal no bruits no thyromegaly Lungs clear with no wheezing and good diaphragmatic motion Heart:  S1/S2 SEM only 1/6  murmur, no rub, gallop or click PMI normal Abdomen: benighn, BS positve, no tenderness, no AAA no bruit.  No HSM or HJR Distal pulses intact with no bruits No edema Neuro non-focal Skin warm and dry No muscular weakness    EKG:   06/15/15  NSR rate 86  /18/18  SR rate 102 normal    Recent Labs: 10/18/2016: ALT 16; BUN <5; Creatinine, Ser 0.91; Hemoglobin 11.5; Platelets 239; Potassium 3.1; Sodium 135    Lipid Panel No results found for: CHOL, TRIG, HDL, CHOLHDL, VLDL, LDLCALC, LDLDIRECT    Wt Readings from Last 3 Encounters:  12/27/16 162 lb 14.4 oz (73.9 kg)  11/16/16 164 lb 3.2 oz (74.5 kg)  10/06/16 161 lb (73 kg)      Other studies Reviewed: Additional studies/ records that were reviewed today  include: OB/Gyn notes Dr Stefano Gaul.    ASSESSMENT AND PLAN:  1.  Murmur:  Aortic murmur echo with no sub valvular, valvular or supravalvular lesions No need for SBE 2. BP. Low sodium diet  3. OB/GYN:  F/u Dr Katherina Right [facility]    Current medicines are reviewed at length with the patient today.  The patient does not have concerns regarding medicines.  The following changes have been made:  no change  Labs/ tests ordered today include: None   Orders Placed This Encounter  Procedures  . EKG 12-Lead     Disposition:   FU with me as needed or if she gets pregnant again    Signed, Charlton Haws, MD  01/03/2017 10:51 AM    [facility] [address] [place], [place], [place]  [address] Phone: [phone]; Fax: [phone]

## 2017-01-03 ENCOUNTER — Encounter: Payer: Self-pay | Admitting: Cardiovascular Disease

## 2017-01-03 ENCOUNTER — Ambulatory Visit (INDEPENDENT_AMBULATORY_CARE_PROVIDER_SITE_OTHER): Payer: Medicaid Other | Admitting: Cardiovascular Disease

## 2017-01-03 DIAGNOSIS — R011 Cardiac murmur, unspecified: Secondary | ICD-10-CM | POA: Diagnosis not present

## 2017-01-03 NOTE — Patient Instructions (Addendum)
Medication Instructions:  Your physician recommends that you continue on your current medications as directed. Please refer to the Current Medication list given to you today.  Labwork: NONE  Testing/Procedures: NONE  Follow-Up: Your physician wants you to follow-up as needed with  Dr. Nishan.    If you need a refill on your cardiac medications before your next appointment, please call your pharmacy.    

## 2017-01-11 ENCOUNTER — Telehealth: Payer: Self-pay | Admitting: *Deleted

## 2017-01-11 ENCOUNTER — Telehealth: Payer: Self-pay | Admitting: Obstetrics

## 2017-01-11 DIAGNOSIS — B9689 Other specified bacterial agents as the cause of diseases classified elsewhere: Secondary | ICD-10-CM

## 2017-01-11 DIAGNOSIS — B3731 Acute candidiasis of vulva and vagina: Secondary | ICD-10-CM

## 2017-01-11 DIAGNOSIS — B373 Candidiasis of vulva and vagina: Secondary | ICD-10-CM

## 2017-01-11 DIAGNOSIS — N760 Acute vaginitis: Principal | ICD-10-CM

## 2017-01-11 MED ORDER — METRONIDAZOLE 500 MG PO TABS: 500.0000 mg | ORAL_TABLET | Freq: Two times a day (BID) | ORAL | 0 refills | Status: DC

## 2017-01-11 MED ORDER — FLUCONAZOLE 150 MG PO TABS: 150.0000 mg | ORAL_TABLET | Freq: Once | ORAL | 0 refills | Status: DC

## 2017-01-11 MED ORDER — FLUCONAZOLE 150 MG PO TABS: 150.0000 mg | ORAL_TABLET | ORAL | 3 refills | Status: DC

## 2017-01-11 NOTE — Telephone Encounter (Signed)
-----   Message from Pennie Banter sent at 01/11/2017  9:06 AM EDT ----- Regarding: 4/11 results This patient just called for her lab results.  It looks like she has BV and yeast.  Did you want to treat her?

## 2017-01-11 NOTE — Telephone Encounter (Signed)
Will send in Flagyl and Diflucan per protocol. Spoke to pt to adv meds at pharmacy. Adv no alcohol while taking Flagyl and to wait to take Diflucan after she completes the Flagyl. Pt expressed understanding

## 2017-01-11 NOTE — Telephone Encounter (Signed)
Patient called to inquire about her 12/27/16 lab results.  Notified her of BV and yeast results.  Instructed her that provider will be sending Rx's to her pharmacy.  Pharmacy verified, correct in system.

## 2017-01-12 ENCOUNTER — Telehealth: Payer: Self-pay | Admitting: *Deleted

## 2017-01-12 ENCOUNTER — Other Ambulatory Visit: Payer: Self-pay | Admitting: Obstetrics

## 2017-01-12 DIAGNOSIS — B9689 Other specified bacterial agents as the cause of diseases classified elsewhere: Secondary | ICD-10-CM

## 2017-01-12 DIAGNOSIS — N760 Acute vaginitis: Principal | ICD-10-CM

## 2017-01-12 MED ORDER — METRONIDAZOLE 0.75 % VA GEL: 1.0000 | Freq: Two times a day (BID) | VAGINAL | 2 refills | Status: DC

## 2017-01-12 NOTE — Telephone Encounter (Signed)
[facility] Vaginal Rx 

## 2017-01-12 NOTE — Telephone Encounter (Signed)
Patient can not swallow the Metronidazole pills and would like an alternative- pill or cream sent to the pharmacy.

## 2017-01-29 ENCOUNTER — Other Ambulatory Visit: Payer: Self-pay | Admitting: Obstetrics

## 2017-01-29 DIAGNOSIS — B379 Candidiasis, unspecified: Secondary | ICD-10-CM

## 2017-04-25 ENCOUNTER — Ambulatory Visit: Payer: Medicaid Other | Admitting: Allergy & Immunology

## 2017-05-31 ENCOUNTER — Ambulatory Visit (INDEPENDENT_AMBULATORY_CARE_PROVIDER_SITE_OTHER): Payer: Medicaid Other | Admitting: Allergy & Immunology

## 2017-05-31 ENCOUNTER — Ambulatory Visit: Payer: Medicaid Other | Admitting: Allergy & Immunology

## 2017-05-31 ENCOUNTER — Encounter: Payer: Self-pay | Admitting: Allergy & Immunology

## 2017-05-31 VITALS — BP 118/70 | HR 77 | Temp 97.8°F | Resp 17 | Ht 64.0 in | Wt 166.8 lb

## 2017-05-31 DIAGNOSIS — L300 Nummular dermatitis: Secondary | ICD-10-CM | POA: Diagnosis not present

## 2017-05-31 DIAGNOSIS — J302 Other seasonal allergic rhinitis: Secondary | ICD-10-CM | POA: Diagnosis not present

## 2017-05-31 DIAGNOSIS — J3089 Other allergic rhinitis: Secondary | ICD-10-CM | POA: Diagnosis not present

## 2017-05-31 DIAGNOSIS — T781XXD Other adverse food reactions, not elsewhere classified, subsequent encounter: Secondary | ICD-10-CM

## 2017-05-31 MED ORDER — CETIRIZINE HCL 10 MG PO TABS: 10.0000 mg | ORAL_TABLET | Freq: Every day | ORAL | 5 refills | Status: AC

## 2017-05-31 MED ORDER — MONTELUKAST SODIUM 10 MG PO TABS: 10.0000 mg | ORAL_TABLET | Freq: Every day | ORAL | 5 refills | Status: AC

## 2017-05-31 MED ORDER — CRISABOROLE 2 % EX OINT: 1.0000 "application " | TOPICAL_OINTMENT | Freq: Two times a day (BID) | CUTANEOUS | 3 refills | Status: DC | PRN

## 2017-05-31 NOTE — Progress Notes (Signed)
NEW PATIENT  Date of Service/Encounter:  05/31/17  Referring provider: Brock Bad, MD   Assessment:   Nummular eczema  Adverse food reaction  Seasonal and perennial allergic rhinitis  Plan/Recommendations:   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 - Continue with Eucrisa twice daily to the affected areas (we will send in a prescription). - Note any foods that seem to worsen it and we can perform testing in the future.  2. Adverse food reaction - Testing to the most common foods was negatve (peanut, tree nut, soy, fish mix, shellfish mix, wheat, milk, egg).  3. Seasonal and perennial allergic rhinitis - Testing today showed: trees, weeds, grasses, molds, dust mites, cat and cockroach - Avoidance measures provided. - Continue with Flonase (fluticasone) two sprays per nostril daily - Start Zyrtec (cetirizine)  tablet once daily and Singulair (montelukast)  daily - You can use an extra dose of the antihistamine, if needed, for breakthrough symptoms.  - Consider nasal saline rinses 1-2 times daily to remove allergens from the nasal cavities as well as help with mucous clearance (this is especially helpful to do before the nasal sprays are given) - Consider allergy shots as a means of long-term control. - Allergy shots "re-train" and "reset" the immune system to ignore environmental allergens and decrease the resulting immune response to those allergens (sneezing, itchy watery eyes, runny nose, nasal congestion, etc).    - Allergy shots improve symptoms in 75-85% of patients.  - Call us back if you are interested in pursuing this after talking to your insurance company.   4. Return in about 3 months (around 08/30/2017).   Subjective:   Tracey Leonard is a 31 y.o. female presenting today for evaluation of  Chief Complaint  Patient presents with  . Allergic Rhinitis   . Rash    Tracey Leonard has a history of the following: Patient  Active Problem List   Diagnosis Date Noted  . Seasonal and perennial allergic rhinitis 05/31/2017  . Nummular eczema 05/31/2017  . Chronic cholecystitis with calculus 10/13/2013  . Gallstones 09/26/2013  . Vaginal delivery 03/23/2013  . Second-degree perineal laceration, with delivery 03/23/2013  . Active labor at term 03/22/2013  . Marginal insertion of umbilical cord - EFW q4wks starting at 24wks 12/02/2012  . Pregnant state, incidental 08/28/2012  . 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 First trimester bleeding 08/07/2012  . Vaginal discharge 08/07/2012    History obtained from: chart review and patient.  Tracey Leonard was referred by Tracey Bad, MD.     Tracey Leonard is a 31 y.o. female presenting for an evaluation of allergies. She reports that she has had allergies for her entire life. This is especially evident during the weather changes and especially in the spring. She endorses itchy watery eyes and rhinorrhea with throat scratching. She does not take anything in particular for this. She did get a nasal spray (Flonase).   She also endorses breakouts ("patches"). Review of Google images shows that she is actually describing nummular eczema. There is no association with diet. She treats it with [place] which does seem to help. She does have samples which does help. She did see a dermatologist in [facility]. She did have a biopsy performed and was given a large bottle of cream. This dermatology visit was in 2014. Tracey Leonard has not been back to see her.   There is no history of food allergies and tolerates all of the major food allergens  without adverse event. She does not have flares with any particular foods. She does have a history of a rash with amoxicillin. She is unsure of the details. This occurred when she was a child. She does not require antibiotics often at all.    Otherwise, there is no history of other atopic diseases, including asthma, drug allergies, stinging  insect allergies, or urticaria. There is no significant infectious history. Vaccinations are up to date.   Past Medical History: Patient Active Problem List   Diagnosis Date Noted  . Seasonal and perennial allergic rhinitis 05/31/2017  . Nummular eczema 05/31/2017  . Chronic cholecystitis with calculus 10/13/2013  . Gallstones 09/26/2013  . Vaginal delivery 03/23/2013  . Second-degree perineal laceration, with delivery 03/23/2013  . Active labor at term 03/22/2013  . Marginal insertion of umbilical cord - EFW q4wks starting at 24wks 12/02/2012  . Pregnant state, incidental 08/28/2012  . 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 First trimester bleeding 08/07/2012  . Vaginal discharge 08/07/2012    Medication List:  Allergies as of 05/31/2017      Reactions   Amoxicillin Rash      Medication List       Accurate as of 05/31/17 12:01 PM. Always use your most recent med list.          cetirizine 10 MG tablet Commonly known as:  ZYRTEC ALLERGY Take 1 tablet (10 mg total) by mouth daily.   famotidine 20 MG tablet Commonly known as:  PEPCID Take 20 mg by mouth daily.   fluticasone 50 MCG/ACT nasal spray Commonly known as:  FLONASE Place 1 spray into both nostrils daily.   montelukast 10 MG tablet Commonly known as:  SINGULAIR Take 1 tablet (10 mg total) by mouth at bedtime.   MULTIPLE VITAMINS/WOMENS PO Take by mouth.            Discharge Care Instructions        Start     Ordered   05/31/17 0000  Allergy Test    Question:  Allergy test to perform  Answer:  1-69   05/31/17 1124   05/31/17 0000  Interdermal Allergy Test    Question Answer Comment  Allergens Mold 1   Allergens Mold 2   Allergens Mold 3   Allergens Mold 4   Allergens Dog   Allergens Cockroach   Allergens Mite Mix      05/31/17 1124   05/31/17 0000  montelukast (SINGULAIR) 10 MG tablet  Daily at bedtime     05/31/17 1124   05/31/17 0000  cetirizine (ZYRTEC ALLERGY) 10 MG  tablet  Daily     05/31/17 1124      Birth History: non-contributory. Born at term without complications.   Developmental History: non-contributory.   Past Surgical History: Past Surgical History:  Procedure Laterality Date  . NO PAST SURGERIES       Family History: Family History  Problem Relation Age of Onset  . Other Father        MVA   She has a four year daughter with asthma and egg allergies.   Social History: Luzmaria lives at home with her 4yo daughter. They live in an apartment. There is carpeting throughout the apartment. They have gas heating and central cooling. There are dust mite coverings on the bedding. There is no tobacco exposure. She is currently now working outside of the home. Her daughter goes to preK at [employer]. [place], but has lived  in [place] for a period of time before moving to [place].      Review of Systems: a 14-point review of systems is pertinent for what is mentioned in HPI.  Otherwise, all other systems were negative. Constitutional: negative other than that listed in the HPI Eyes: negative other than that listed in the HPI Ears, nose, mouth, throat, and face: negative other than that listed in the HPI Respiratory: negative other than that listed in the HPI Cardiovascular: negative other than that listed in the HPI Gastrointestinal: negative other than that listed in the HPI Genitourinary: negative other than that listed in the HPI Integument: negative other than that listed in the HPI Hematologic: negative other than that listed in the HPI Musculoskeletal: negative other than that listed in the HPI Neurological: negative other than that listed in the HPI Allergy/Immunologic: negative other than that listed in the HPI    Objective:   Blood pressure 118/70, pulse 77, temperature 97.8 F (36.6 C), resp. rate 17, height  (1.626 m), weight 166 lb 12.8 oz (75.7 kg), SpO2 96 %. Body mass index is  28.63 kg/m.   Physical Exam:  General: Alert, interactive, in no acute distress. Very pleasant female.  Eyes: No conjunctival injection present on the right, No conjunctival injection present on the left, PERRL bilaterally, No discharge on the right, No discharge on the left and No Horner-Trantas dots present Ears: Right TM pearly gray with normal light reflex, Left TM pearly gray with normal light reflex, Right TM intact without perforation and Left TM intact without perforation.  Nose/Throat: External nose within normal limits and septum midline, turbinates edematous and pale with clear discharge, post-pharynx erythematous with cobblestoning in the posterior oropharynx. Tonsils 2+ without exudates Neck: Supple without thyromegaly.  Adenopathy: shoddy bilateral anterior cervical lymphadenopathy. and no enlarged lymph nodes appreciated in the occipital, axillary, epitrochlear, inguinal, or popliteal regions. Lungs: Clear to auscultation without wheezing, rhonchi or rales. No increased work of breathing. CV: Normal S1/S2, no murmurs. Capillary refill <2 seconds.  Abdomen: Nondistended, nontender. No guarding or rebound tenderness. Bowel sounds faint and present in all fields  Skin: Warm and dry, without lesions or rashes. Extremities:  No clubbing, cyanosis or edema. Neuro:   Grossly intact. No focal deficits appreciated. Responsive to questions.  Diagnostic studies:   Allergy Studies:   Indoor/Outdoor Percutaneous Adult Environmental Panel: positive to bahia grass, [place] grass, johnson grass, [place] blue grass, meadow fescue grass, perennial rye grass, sweet vernal grass, timothy grass, cocklebur, burweed marsh elder, short ragweed, giant ragweed, rough marsh elder, ash, birch, American beech, Box elder, hickory, oak, pecan pollen, black walnut pollen and cat. Otherwise negative with adequate controls.  Indoor/Outdoor Selected Intradermal Environmental Panel: positive to mold mix #1,  mold mix #3, mold mix #4, cockroach and mite mix. Otherwise negative with adequate controls.  Most Common Foods Panel (peanut, cashew, soy, fish mix, shellfish mix, wheat, milk, egg): negative to all with adequate controls     Malachi Bonds, MD FAAAAI [facility]

## 2017-05-31 NOTE — Patient Instructions (Addendum)
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 - Continue with Eucrisa twice daily to the affected areas (we will send in a prescription). - Note any foods that seem to worsen it and we can perform testing in the future.  2. Adverse food reaction - Testing to the most common foods was negatve (peanut, tree nut, soy, fish mix, shellfish mix, wheat, milk, egg).  3. Seasonal and perennial allergic rhinitis - Testing today showed: trees, weeds, grasses, molds, dust mites, cat and cockroach - Avoidance measures provided. - Continue with Flonase (fluticasone) two sprays per nostril daily - Start Zyrtec (cetirizine) 10mg  tablet once daily and Singulair (montelukast) 10mg  daily - You can use an extra dose of the antihistamine, if needed, for breakthrough symptoms.  - Consider nasal saline rinses 1-2 times daily to remove allergens from the nasal cavities as well as help with mucous clearance (this is especially helpful to do before the nasal sprays are given) - Consider allergy shots as a means of long-term control. - Allergy shots "re-train" and "reset" the immune system to ignore environmental allergens and decrease the resulting immune response to those allergens (sneezing, itchy watery eyes, runny nose, nasal congestion, etc).    - Allergy shots improve symptoms in 75-85% of patients.  - Call us back if you are interested in pursuing this after talking to your insurance company.   3. Return in about 3 months (around 08/30/2017).   Please inform us of any Emergency Department visits, hospitalizations, or changes in symptoms. Call us before going to the ED for breathing or allergy symptoms since we might be able to fit you in for a sick visit. Feel free to contact us anytime with any questions, problems, or concerns.  It was a pleasure to meet you today! Enjoy the upcoming fall season! Good luck with this crazy weather!   Websites that have reliable patient information: 1.  American Academy of Asthma, Allergy, and Immunology: www.aaaai.org 2. Food Allergy Research and Education (FARE): foodallergy.org 3. Mothers of Asthmatics: http://www.asthmacommunitynetwork.org 4. American College of Allergy, Asthma, and Immunology: www.acaai.org   Election Day is coming up on Tuesday, November 6th! Make your voice heard! Register to vote at [email or web address]!        Reducing Pollen Exposure  The American Academy of Allergy, Asthma and Immunology suggests the following steps to reduce your exposure to pollen during allergy seasons.    1. Do not hang sheets or clothing out to dry; pollen may collect on these items. 2. Do not mow lawns or spend time around freshly cut grass; mowing stirs up pollen. 3. Keep windows closed at night.  Keep car windows closed while driving. 4. Minimize morning activities outdoors, a time when pollen counts are usually at their highest. 5. Stay indoors as much as possible when pollen counts or humidity is high and on windy days when pollen tends to remain in the air longer. 6. Use air conditioning when possible.  Many air conditioners have filters that trap the pollen spores. 7. Use a HEPA room air filter to remove pollen form the indoor air you breathe.  Control of Dog or Cat Allergen  Avoidance is the best way to manage a dog or cat allergy. If you have a dog or cat and are allergic to dog or cats, consider removing the dog or cat from the home. If you have a dog or cat but don't want to find it a new home, or if your family wants a pet even though  someone in the household is allergic, here are some strategies that may help keep symptoms at bay:  1. Keep the pet out of your bedroom and restrict it to only a few rooms. Be advised that keeping the dog or cat in only one room will not limit the allergens to that room. 2. Don't pet, hug or kiss the dog or cat; if you do, wash your hands with soap and water. 3. High-efficiency particulate air (HEPA)  cleaners run continuously in a bedroom or living room can reduce allergen levels over time. 4. Regular use of a high-efficiency vacuum cleaner or a central vacuum can reduce allergen levels. 5. Giving your dog or cat a bath at least once a week can reduce airborne allergen.  Control of Cockroach Allergen  Cockroach allergen has been identified as an important cause of acute attacks of asthma, especially in urban settings.  There are fifty-five species of cockroach that exist in the [place], however only three, the [place], [place] species produce allergen that can affect patients with Asthma.  Allergens can be obtained from fecal particles, egg casings and secretions from cockroaches.    1. Remove food sources. 2. Reduce access to water. 3. Seal access and entry points. 4. Spray runways with 0.5-1% Diazinon or Chlorpyrifos 5. Blow boric acid power under stoves and refrigerator. 6. Place bait stations (hydramethylnon) at feeding sites.  Control of Mold Allergen  Mold and fungi can grow on a variety of surfaces provided certain temperature and moisture conditions exist.  Outdoor molds grow on plants, decaying vegetation and soil.  The major outdoor mold, Alternaria and Cladosporium, are found in very high numbers during hot and dry conditions.  Generally, a late Summer - Fall peak is seen for common outdoor fungal spores.  Rain will temporarily lower outdoor mold spore count, but counts rise rapidly when the rainy period ends.  The most important indoor molds are Aspergillus and Penicillium.  Dark, humid and poorly ventilated basements are ideal sites for mold growth.  The next most common sites of mold growth are the bathroom and the kitchen.  Outdoor [employer] 1. Use air conditioning and keep windows closed 2. Avoid exposure to decaying vegetation. 3. Avoid leaf raking. 4. Avoid grain handling. 5. Consider wearing a face mask if working in moldy areas.  Indoor Mold  Control 1. Maintain humidity below 50%. 2. Clean washable surfaces with 5% bleach solution. 3. Remove sources e.g. contaminated carpets.

## 2017-07-02 ENCOUNTER — Other Ambulatory Visit: Payer: Self-pay | Admitting: Obstetrics

## 2017-07-02 DIAGNOSIS — N760 Acute vaginitis: Principal | ICD-10-CM

## 2017-07-02 DIAGNOSIS — B9689 Other specified bacterial agents as the cause of diseases classified elsewhere: Secondary | ICD-10-CM

## 2017-08-30 ENCOUNTER — Ambulatory Visit: Payer: Medicaid Other | Admitting: Allergy & Immunology

## 2017-09-20 ENCOUNTER — Ambulatory Visit: Payer: Medicaid Other | Admitting: Allergy & Immunology

## 2017-09-27 ENCOUNTER — Ambulatory Visit: Payer: Medicaid Other | Admitting: Allergy & Immunology

## 2017-09-27 ENCOUNTER — Encounter: Payer: Self-pay | Admitting: Allergy & Immunology

## 2017-09-27 VITALS — BP 110/76 | HR 88 | Resp 16

## 2017-09-27 DIAGNOSIS — J302 Other seasonal allergic rhinitis: Secondary | ICD-10-CM | POA: Diagnosis not present

## 2017-09-27 DIAGNOSIS — L300 Nummular dermatitis: Secondary | ICD-10-CM | POA: Diagnosis not present

## 2017-09-27 DIAGNOSIS — J3089 Other allergic rhinitis: Secondary | ICD-10-CM

## 2017-09-27 MED ORDER — CRISABOROLE 2 % EX OINT: 1.0000 "application " | TOPICAL_OINTMENT | Freq: Two times a day (BID) | CUTANEOUS | 3 refills | Status: DC | PRN

## 2017-09-27 MED ORDER — CLOBETASOL PROPIONATE 0.025 % EX CREA: 1.0000 "application " | TOPICAL_CREAM | Freq: Two times a day (BID) | CUTANEOUS | 0 refills | Status: DC | PRN

## 2017-09-27 NOTE — Patient Instructions (Addendum)
1. Nummular eczema - with continued flares on hands - Continue with moisturizing twice daily (Eucerin is excellent). - Continue with Eucrisa twice daily to the affected areas. - We will add on clobetasol cream twice daily (do not use longer than two weeks).  - Note any foods that seem to worsen it and we can perform testing in the future.  2. Seasonal and perennial allergic rhinitis (trees, weeds, grasses, molds, dust mites, cat and cockroach) - Continue with Zyrtec (cetirizine) 10mg  tablet once daily, Singulair (montelukast) 10mg  daily and Flonase (fluticasone) two sprays per nostril daily - You can use an extra dose of the antihistamine, if needed, for breakthrough symptoms.  - Consider nasal saline rinses 1-2 times daily to remove allergens from the nasal cavities as well as help with mucous clearance (this is especially helpful to do before the nasal sprays are given) - Allergy consent signed today. - Make an appointment in two weeks for your first injection.   3. Return in about 6 months (around 03/27/2018).   Please inform us of any Emergency Department visits, hospitalizations, or changes in symptoms. Call us before going to the ED for breathing or allergy symptoms since we might be able to fit you in for a sick visit. Feel free to contact us anytime with any questions, problems, or concerns.  It was a pleasure to see you again today! Happy New Year!   Websites that have reliable patient information: 1. American Academy of Asthma, Allergy, and Immunology: www.aaaai.org 2. Food Allergy Research and Education (FARE): foodallergy.org 3. Mothers of Asthmatics: http://www.asthmacommunitynetwork.org 4. American College of Allergy, Asthma, and Immunology: www.acaai.org

## 2017-09-27 NOTE — Progress Notes (Signed)
FOLLOW UP  Date of Service/Encounter:  09/27/17   Assessment:   Nummular eczema  Seasonal and perennial allergic rhinitis - interested in starting allergen immunotherapy   Plan/Recommendations:   1. Nummular eczema - with continued flares on hands - Continue with moisturizing twice daily (Eucerin is excellent). - Continue with Eucrisa twice daily to the affected areas. - We will add on clobetasol cream twice daily (do not use longer than two weeks).  - Note any foods that seem to worsen it and we can perform testing in the future.  2. Seasonal and perennial allergic rhinitis (trees, weeds, grasses, molds, dust mites, cat and cockroach) - Continue with Zyrtec (cetirizine) 10mg  tablet once daily, Singulair (montelukast) 10mg  daily and Flonase (fluticasone) two sprays per nostril daily - You can use an extra dose of the antihistamine, if needed, for breakthrough symptoms.  - Consider nasal saline rinses 1-2 times daily to remove allergens from the nasal cavities as well as help with mucous clearance (this is especially helpful to do before the nasal sprays are given) - Allergy consent signed today. - Make an appointment in two weeks for your first injection.   3. Return in about 6 months (around 03/27/2018).  Subjective:   Tracey Leonard is a 32 y.o. female presenting today for follow up of  Chief Complaint  Patient presents with  . Eczema    Tracey Leonard has a history of the following: Patient Active Problem List   Diagnosis Date Noted  . Seasonal and perennial allergic rhinitis 05/31/2017  . Nummular eczema 05/31/2017  . Chronic cholecystitis with calculus 10/13/2013  . Gallstones 09/26/2013  . Vaginal delivery 03/23/2013  . Second-degree perineal laceration, with delivery 03/23/2013  . Active labor at term 03/22/2013  . Marginal insertion of umbilical cord - EFW q4wks starting at 24wks 12/02/2012  . Pregnant state, incidental 08/28/2012  . 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 First trimester bleeding 08/07/2012  . Vaginal discharge 08/07/2012    History obtained from: chart review and patient.  Arlyss Gandyonii S Coggin's Primary Care Provider is Brock BadHarper, Charles A, MD.     Tracey Leonard is a 32 y.o. female presenting for a follow up visit.  She was last seen in September 2018.  At that time, I diagnosed her with nummular eczema and started [place]a twice daily.  We did do testing to the most common foods because she was concerned this was related to her rash.  All of the most common foods were negative.  We also tested for environmental allergens, and she showed sensitizations to trees, weeds, grasses, molds, dust mite, cat, and cockroach.  They started her on Zyrtec 10 mg daily as well as Singulair 10 mg daily in conjunction with Flonase 2 sprays per nostril daily.  We did briefly discuss allergy shots.  Since the last visit, she has done well.  Her nummular eczema did respond well to [place]ucrisa.  She continues to have intermittent flares on her hands, complete with vesicles.  The Pam D[place]ucrisa is providing some relief of this.  She is unsure what is triggering the rash.  She currently works as at a [employer] at [employer].  She also volunteers at [employer] on Sundays and thinks that something there might be an irritant.  She does not have any topical steroids to use on this.  She has only ever tried the [place]ucrisa and she uses Eucerin twice daily.  Allergic rhinitis symptoms are somewhat controlled, but she is interested in starting allergy shots  today as a means of long-term control.  She remains on the cetirizine, Singulair, and Flonase.  Otherwise, there have been no changes to her past medical history, surgical history, family history, or social history.    Review of Systems: a 14-point review of systems is pertinent for what is mentioned in HPI.  Otherwise, all other systems were negative. Constitutional: negative other than that listed in the  HPI Eyes: negative other than that listed in the HPI Ears, nose, mouth, throat, and face: negative other than that listed in the HPI Respiratory: negative other than that listed in the HPI Cardiovascular: negative other than that listed in the HPI Gastrointestinal: negative other than that listed in the HPI Genitourinary: negative other than that listed in the HPI Integument: negative other than that listed in the HPI Hematologic: negative other than that listed in the HPI Musculoskeletal: negative other than that listed in the HPI Neurological: negative other than that listed in the HPI Allergy/Immunologic: negative other than that listed in the HPI    Objective:   Blood pressure 110/76, pulse 88, resp. rate 16, SpO2 96 %. There is no height or weight on file to calculate BMI.   Physical Exam:  General: Alert, interactive, in no acute distress. Pleasant and talkative.  Eyes: No conjunctival injection bilaterally, no discharge on the right, no discharge on the left and no Horner-Trantas dots present. PERRL bilaterally. EOMI without pain. No photophobia.  Ears: Right TM pearly gray with normal light reflex, Left TM pearly gray with normal light reflex, Right TM intact without perforation and Left TM intact without perforation.  Nose/Throat: External nose within normal limits and septum midline. Turbinates edematous without discharge. Posterior oropharynx mildly erythematous without cobblestoning in the posterior oropharynx. Tonsils 2+ without exudates.  Tongue without thrush. Adenopathy: no enlarged lymph nodes appreciated in the anterior cervical, occipital, axillary, epitrochlear, inguinal, or popliteal regions. Lungs: Clear to auscultation without wheezing, rhonchi or rales. No increased work of breathing. CV: Normal S1/S2. No murmurs. Capillary refill <2 seconds.  Skin: Warm and dry, without lesions or rashes aside from an oval shaped area on the palm of her right hand.  Neuro:    Grossly intact. No focal deficits appreciated. Responsive to questions.  Diagnostic studies: none     Malachi Bonds, MD [facility]

## 2017-11-22 ENCOUNTER — Ambulatory Visit: Payer: Self-pay | Admitting: Obstetrics

## 2017-11-29 ENCOUNTER — Encounter: Payer: Self-pay | Admitting: Obstetrics

## 2017-11-29 ENCOUNTER — Other Ambulatory Visit: Payer: Self-pay

## 2017-11-29 ENCOUNTER — Ambulatory Visit: Payer: Medicaid Other | Admitting: Obstetrics

## 2017-11-29 ENCOUNTER — Other Ambulatory Visit (HOSPITAL_COMMUNITY)
Admission: RE | Admit: 2017-11-29 | Discharge: 2017-11-29 | Disposition: A | Discharge status: Home / Self Care | Payer: Medicaid Other | Source: Ambulatory Visit | Attending: Obstetrics | Admitting: Obstetrics

## 2017-11-29 VITALS — BP 128/85 | HR 74 | Ht 64.0 in | Wt 180.5 lb

## 2017-11-29 DIAGNOSIS — Z3169 Encounter for other general counseling and advice on procreation: Secondary | ICD-10-CM

## 2017-11-29 DIAGNOSIS — Z3009 Encounter for other general counseling and advice on contraception: Secondary | ICD-10-CM

## 2017-11-29 DIAGNOSIS — Z0000 Encounter for general adult medical examination without abnormal findings: Secondary | ICD-10-CM

## 2017-11-29 DIAGNOSIS — Z01419 Encounter for gynecological examination (general) (routine) without abnormal findings: Secondary | ICD-10-CM | POA: Insufficient documentation

## 2017-11-29 DIAGNOSIS — N898 Other specified noninflammatory disorders of vagina: Secondary | ICD-10-CM

## 2017-11-29 MED ORDER — VITAFOL GUMMIES 3.33-0.333-34.8 MG PO CHEW: 3.0000 | CHEWABLE_TABLET | Freq: Every day | ORAL | 4 refills | Status: DC

## 2017-11-29 NOTE — Progress Notes (Signed)
Presents for AEX/PAP/Cultures 

## 2017-11-29 NOTE — Progress Notes (Signed)
Subjective:        Tracey Leonard is a 32 y.o. female here for a routine exam.  Current complaints: None.    Personal health questionnaire:  Is patient Ashkenazi Jewish, have a family history of breast and/or ovarian cancer: no Is there a family history of uterine cancer diagnosed at age < 60, gastrointestinal cancer, urinary tract cancer, family member who is a Personnel officer syndrome-associated carrier: no Is the patient overweight and hypertensive, family history of diabetes, personal history of gestational diabetes, preeclampsia or PCOS: no Is patient over 16, have PCOS,  family history of premature CHD under age 35, diabetes, smoke, have hypertension or peripheral artery disease:  no At any time, has a partner hit, kicked or otherwise hurt or frightened you?: no Over the past 2 weeks, have you felt down, depressed or hopeless?: no Over the past 2 weeks, have you felt little interest or pleasure in doing things?:no   Gynecologic History Patient's last menstrual period was 11/23/2017. Contraception: none Last Pap: 2018. Results were: normal Last mammogram: n/a. Results were: n/a  Obstetric History OB History  Gravida Para Term Preterm AB Living  1 1 1     1   SAB TAB Ectopic Multiple Live Births          1    # Outcome Date GA Lbr Len/2nd Weight Sex Delivery Anes PTL Lv  1 Term 03/22/13 [redacted]w[redacted]d 04:34 / 00:23 6 lb 2.2 oz (2.784 kg) F Vag-Spont EPI  LIV      Past Medical History:  Diagnosis Date  . Cardiac murmur    seen by Dr. Charlton Haws 05/2015; echo ordered, no record of echo having been done. Next appt 11/22/16  . Chronic headaches   . Dorsal wrist ganglion 01/2016   right  . Low blood potassium     Past Surgical History:  Procedure Laterality Date  . NO PAST SURGERIES       Current Outpatient Medications:  .  calcium carbonate (TUMS - DOSED IN MG ELEMENTAL CALCIUM) 500 MG chewable tablet, Chew 1 tablet by mouth daily., Disp: , Rfl:  .  cetirizine (ZYRTEC ALLERGY) 10 MG  tablet, Take 1 tablet (10 mg total) by mouth daily., Disp: 30 tablet, Rfl: 5 .  Clobetasol Propionate 0.025 % CREA, Apply 1 application topically 2 (two) times daily as needed. Do not use longer than 14 days., Disp: 60 g, Rfl: 0 .  Crisaborole (EUCRISA) 2 % OINT, Apply 1 application topically 2 (two) times daily as needed., Disp: 60 g, Rfl: 3 .  fluticasone (FLONASE) 50 MCG/ACT nasal spray, Place 1 spray into both nostrils daily., Disp: , Rfl:  .  montelukast (SINGULAIR) 10 MG tablet, Take 1 tablet (10 mg total) by mouth at bedtime., Disp: 30 tablet, Rfl: 5 .  Multiple Vitamins-Minerals (MULTIPLE VITAMINS/WOMENS PO), Take by mouth., Disp: , Rfl:  .  Prenatal Vit-Fe Phos-FA-Omega (VITAFOL GUMMIES) 3.33-0.333-34.8 MG CHEW, Chew 3 tablets by mouth daily before breakfast., Disp: 90 tablet, Rfl: 4 .  TRI-LO-SPRINTEC 0.18/0.215/0.25 MG-25 MCG tab, TK 1 T PO QD, Disp: , Rfl: 5 Allergies  Allergen Reactions  . Amoxicillin Rash    Social History   Tobacco Use  . Smoking status: Never Smoker  . Smokeless tobacco: Never Used  Substance Use Topics  . Alcohol use: No    Alcohol/week: 0.0 oz    Family History  Problem Relation Age of Onset  . Other Father        MVA  Review of Systems  Constitutional: negative for fatigue and weight loss Respiratory: negative for cough and wheezing Cardiovascular: negative for chest pain, fatigue and palpitations Gastrointestinal: negative for abdominal pain and change in bowel habits Musculoskeletal:negative for myalgias Neurological: negative for gait problems and tremors Behavioral/Psych: negative for abusive relationship, depression Endocrine: negative for temperature intolerance    Genitourinary:negative for abnormal menstrual periods, genital lesions, hot flashes, sexual problems and vaginal discharge Integument/breast: negative for breast lump, breast tenderness, nipple discharge and skin lesion(s)    Objective:       BP 128/85   Pulse 74    Ht 5\' 4"  (1.626 m)   Wt 180 lb 8 oz (81.9 kg)   LMP 11/23/2017   BMI 30.98 kg/m  General:   alert  Skin:   no rash or abnormalities  Lungs:   clear to auscultation bilaterally  Heart:   regular rate and rhythm, S1, S2 normal, no murmur, click, rub or gallop  Breasts:   normal without suspicious masses, skin or nipple changes or axillary nodes  Abdomen:  normal findings: no organomegaly, soft, non-tender and no hernia  Pelvis:  External genitalia: normal general appearance Urinary system: urethral meatus normal and bladder without fullness, nontender Vaginal: normal without tenderness, induration or masses Cervix: normal appearance Adnexa: normal bimanual exam Uterus: anteverted and non-tender, normal size   Lab Review Urine pregnancy test Labs reviewed yes Radiologic studies reviewed no  50% of 20 min visit spent on counseling and coordination of care.   Assessment:     1. Encounter for routine gynecological examination with Papanicolaou smear of cervix Rx: - Cytology - PAP  2. Encounter for other general counseling and advice on contraception - wants to conceive  3. Encounter for preconception consultation Rx: - Prenatal Vit-Fe Phos-FA-Omega (VITAFOL GUMMIES) 3.33-0.333-34.8 MG CHEW; Chew 3 tablets by mouth daily before breakfast.  Dispense: 90 tablet; Refill: 4  4. Vaginal discharge Rx: - Cervicovaginal ancillary only    Plan:    Education reviewed: calcium supplements, depression evaluation, low fat, low cholesterol diet, safe sex/STD prevention, self breast exams and weight bearing exercise. Contraception: none and wants to get pregnant. Follow up in: 1 year.   Meds ordered this encounter  Medications  . Prenatal Vit-Fe Phos-FA-Omega (VITAFOL GUMMIES) 3.33-0.333-34.8 MG CHEW    Sig: Chew 3 tablets by mouth daily before breakfast.    Dispense:  90 tablet    Refill:  4   No orders of the defined types were placed in this encounter.   Brock BadHARLES A. HARPER  MD

## 2017-11-30 LAB — CERVICOVAGINAL ANCILLARY ONLY
Bacterial vaginitis: POSITIVE — AB
CHLAMYDIA, DNA PROBE: POSITIVE — AB
Candida vaginitis: NEGATIVE
NEISSERIA GONORRHEA: NEGATIVE
Trichomonas: NEGATIVE

## 2017-12-03 LAB — CYTOLOGY - PAP
DIAGNOSIS: NEGATIVE
HPV (WINDOPATH): NOT DETECTED

## 2017-12-04 ENCOUNTER — Other Ambulatory Visit: Payer: Self-pay | Admitting: Obstetrics

## 2017-12-04 DIAGNOSIS — B9689 Other specified bacterial agents as the cause of diseases classified elsewhere: Secondary | ICD-10-CM

## 2017-12-04 DIAGNOSIS — A749 Chlamydial infection, unspecified: Secondary | ICD-10-CM

## 2017-12-04 DIAGNOSIS — N760 Acute vaginitis: Secondary | ICD-10-CM

## 2017-12-04 MED ORDER — SECNIDAZOLE 2 G PO PACK: 1.0000 | PACK | Freq: Once | ORAL | 2 refills | Status: AC

## 2017-12-04 MED ORDER — AZITHROMYCIN 500 MG PO TABS: 1000.0000 mg | ORAL_TABLET | Freq: Once | ORAL | 0 refills | Status: AC

## 2017-12-04 MED ORDER — CEFIXIME 400 MG PO CAPS: 400.0000 mg | ORAL_CAPSULE | Freq: Once | ORAL | 0 refills | Status: AC

## 2018-05-07 ENCOUNTER — Ambulatory Visit: Payer: Self-pay | Admitting: Obstetrics

## 2018-05-15 ENCOUNTER — Ambulatory Visit: Payer: Self-pay | Admitting: Obstetrics

## 2018-09-01 ENCOUNTER — Emergency Department (HOSPITAL_BASED_OUTPATIENT_CLINIC_OR_DEPARTMENT_OTHER): Payer: Medicaid Other

## 2018-09-01 ENCOUNTER — Other Ambulatory Visit: Payer: Self-pay

## 2018-09-01 ENCOUNTER — Emergency Department (HOSPITAL_BASED_OUTPATIENT_CLINIC_OR_DEPARTMENT_OTHER)
Admission: EM | Admit: 2018-09-01 | Discharge: 2018-09-01 | Disposition: A | Discharge status: Home / Self Care | Payer: Medicaid Other | Attending: Emergency Medicine | Admitting: Emergency Medicine

## 2018-09-01 ENCOUNTER — Encounter (HOSPITAL_BASED_OUTPATIENT_CLINIC_OR_DEPARTMENT_OTHER): Payer: Self-pay | Admitting: Adult Health

## 2018-09-01 DIAGNOSIS — R05 Cough: Secondary | ICD-10-CM | POA: Insufficient documentation

## 2018-09-01 DIAGNOSIS — R059 Cough, unspecified: Secondary | ICD-10-CM

## 2018-09-01 DIAGNOSIS — J3489 Other specified disorders of nose and nasal sinuses: Secondary | ICD-10-CM | POA: Insufficient documentation

## 2018-09-01 DIAGNOSIS — Z79899 Other long term (current) drug therapy: Secondary | ICD-10-CM | POA: Diagnosis not present

## 2018-09-01 MED ORDER — FLUTICASONE PROPIONATE 50 MCG/ACT NA SUSP: 1.0000 | Freq: Every day | NASAL | 2 refills | Status: DC

## 2018-09-01 MED ORDER — GUAIFENESIN 100 MG/5ML PO SYRP: 100.0000 mg | ORAL_SOLUTION | ORAL | 0 refills | Status: DC | PRN

## 2018-09-01 NOTE — Discharge Instructions (Signed)
Evaluated today for upper respiratory symptoms.  Is likely a viral upper respiratory infection.  I have given you some things to help with your cough and the nose.  Please follow-up with your PCP for reevaluation of your symptoms do not resolve in a week.  Return to the ED for any new or worsening symptoms.

## 2018-09-01 NOTE — ED Triage Notes (Signed)
PResents with post nasal drip, cough and runny nose that began Saturday. She denies fevers.

## 2018-09-01 NOTE — ED Provider Notes (Signed)
[facility] EMERGENCY DEPARTMENT Provider Note   CSN: [id number] Arrival date & time: 09/01/18  1110   History   Chief Complaint Chief Complaint  Patient presents with  . URI    HPI Tracey Leonard is a 32 y.o. female with past medical history significant for chronic headaches who presents for evaluation of rhinorrhea, cough and congestion.  States symptoms have been present x48 hours.  States her cough is productive of clear with occasional light yellow sputum.  Patient states she has rhinorrhea with light yellow nasal discharge.  Patient states her cough is worse at night when she goes to lay down.  Patient states she feels like her nasal congestion drips down the back of her throat.  Denies history of CHF, pulmonary edema, PND, orthopnea, lower extremity swelling, hemoptysis, fever, chills, sore throat, chest pain, shortness of breath, wheezing, abdominal pain, nausea or vomiting, body aches and pains.  Denies sick contacts.  Denies influenza vaccination.  Has not taking anything for her symptoms.  Denies aggravating or alleviating factors unless otherwise stated above.  History obtained from patient.  No interpreter was used.  HPI  Past Medical History:  Diagnosis Date  . Cardiac murmur    seen by Dr. Charlton Haws 05/2015; echo ordered, no record of echo having been done. Next appt 11/22/16  . Chronic headaches   . Dorsal wrist ganglion 01/2016   right  . Low blood potassium     Patient Active Problem List   Diagnosis Date Noted  . Seasonal and perennial allergic rhinitis 05/31/2017  . Nummular eczema 05/31/2017  . Chronic cholecystitis with calculus 10/13/2013  . Gallstones 09/26/2013  . Vaginal delivery 03/23/2013  . Second-degree perineal laceration, with delivery 03/23/2013  . Active labor at term 03/22/2013  . Marginal insertion of umbilical cord - EFW q4wks starting at 24wks 12/02/2012  . Pregnant state, incidental 08/28/2012  . 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 First trimester bleeding 08/07/2012  . Vaginal discharge 08/07/2012    Past Surgical History:  Procedure Laterality Date  . NO PAST SURGERIES       OB History    Gravida  1   Para  1   Term  1   Preterm      AB      Living  1     SAB      TAB      Ectopic      Multiple      Live Births  1            Home Medications    Prior to Admission medications   Medication Sig Start Date End Date Taking? Authorizing Provider  calcium carbonate (TUMS - DOSED IN MG ELEMENTAL CALCIUM) 500 MG chewable tablet Chew 1 tablet by mouth daily.    [provider]  cetirizine (ZYRTEC ALLERGY) 10 MG tablet Take 1 tablet (10 mg total) by mouth daily. 05/31/17   Alfonse Spruce, MD  Clobetasol Propionate 0.025 % CREA Apply 1 application topically 2 (two) times daily as needed. Do not use longer than 14 days. 09/27/17   Alfonse Spruce, MD  Crisaborole (EUCRISA) 2 % OINT Apply 1 application topically 2 (two) times daily as needed. 09/27/17   Alfonse Spruce, MD  fluticasone [facility]) 50 MCG/ACT nasal spray Place 1 spray into both nostrils daily. 09/01/18   Mandi Mattioli A, PA-C  guaifenesin (ROBITUSSIN) 100 MG/5ML syrup Take 5-10 mLs (100-200 mg total) by mouth every 4 (  four) hours as needed for cough. 09/01/18   Kwanza Cancelliere A, PA-C  montelukast (SINGULAIR) 10 MG tablet Take 1 tablet (10 mg total) by mouth at bedtime. 05/31/17   Alfonse SpruceGallagher, Joel Louis, MD  Multiple Vitamins-Minerals (MULTIPLE VITAMINS/WOMENS PO) Take by mouth.    [provider]  Prenatal Vit-Fe Phos-FA-Omega (VITAFOL GUMMIES) 3.33-0.333-34.8 MG CHEW Chew 3 tablets by mouth daily before breakfast. 11/29/17   Brock BadHarper, Charles A, MD  TRI-LO-SPRINTEC 0.18/0.215/0.25 MG-25 MCG tab TK 1 T PO QD 09/03/17   [provider]    Family History Family History  Problem Relation Age of Onset  . Other Father        MVA    Social History Social  History   Tobacco Use  . Smoking status: Never Smoker  . Smokeless tobacco: Never Used  Substance Use Topics  . Alcohol use: No    Alcohol/week: 0.0 standard drinks  . Drug use: No     Allergies   Amoxicillin   Review of Systems Review of Systems  Constitutional: Negative.   HENT: Positive for congestion, postnasal drip, rhinorrhea and sinus pressure. Negative for drooling, ear discharge, ear pain, facial swelling, hearing loss, mouth sores, nosebleeds, sinus pain, sneezing, sore throat, tinnitus, trouble swallowing and voice change.   Respiratory: Negative.   Cardiovascular: Negative.   Genitourinary: Negative.   Musculoskeletal: Negative.   Skin: Negative.   Neurological: Negative.   All other systems reviewed and are negative.    Physical Exam Updated Vital Signs BP 121/84 (BP Location: Right Arm)   Pulse (!) 103   Temp 99.6 F (37.6 C) (Oral)   Resp 20   Ht 5\' 4"  (1.626 m)   Wt 77.1 kg   SpO2 97%   BMI 29.18 kg/m   Physical Exam Vitals signs and nursing note reviewed.  Constitutional:      General: She is not in acute distress.    Appearance: She is well-developed. She is not ill-appearing, toxic-appearing or diaphoretic.  HENT:     Head: Normocephalic and atraumatic.     Jaw: No trismus, tenderness, swelling or pain on movement.     Right Ear: Tympanic membrane, ear canal and external ear normal. No drainage, swelling or tenderness. No middle ear effusion. Tympanic membrane is not scarred, perforated, erythematous or retracted.     Left Ear: Tympanic membrane, ear canal and external ear normal. No drainage, swelling or tenderness.  No middle ear effusion. Tympanic membrane is not scarred, perforated, erythematous or retracted.     Nose: Rhinorrhea present. No nasal deformity, signs of injury, nasal tenderness or congestion. Rhinorrhea is clear.     Right Nostril: No foreign body or epistaxis.     Left Nostril: No foreign body or epistaxis.     Right  Turbinates: Not pale.     Left Turbinates: Not pale.     Right Sinus: No maxillary sinus tenderness or frontal sinus tenderness.     Left Sinus: No maxillary sinus tenderness or frontal sinus tenderness.     Mouth/Throat:     Lips: Pink.     Mouth: Mucous membranes are moist.     Tongue: No lesions.     Palate: No mass and lesions.     Pharynx: Oropharynx is clear. Uvula midline. No pharyngeal swelling, oropharyngeal exudate, posterior oropharyngeal erythema or uvula swelling.     Tonsils: No tonsillar exudate or tonsillar abscesses.  Eyes:     Pupils: Pupils are equal, round, and reactive to light.  Neck:     Musculoskeletal: Full passive range of motion without pain and normal range of motion. Normal range of motion. No erythema.  Cardiovascular:     Rate and Rhythm: Normal rate.     Pulses: Normal pulses.     Heart sounds: Normal heart sounds.  Pulmonary:     Effort: Pulmonary effort is normal. No tachypnea, accessory muscle usage, prolonged expiration, respiratory distress or retractions.     Breath sounds: Normal breath sounds and air entry. No stridor or decreased air movement. No decreased breath sounds, wheezing, rhonchi or rales.  Abdominal:     General: Bowel sounds are normal. There is no distension.     Palpations: Abdomen is soft.     Tenderness: There is no abdominal tenderness.  Musculoskeletal: Normal range of motion.     Comments: Moves all extremities without difficulty or without ataxia.  Skin:    General: Skin is warm and dry.  Neurological:     Mental Status: She is alert.      ED Treatments / Results  Labs (all labs ordered are listed, but only abnormal results are displayed) Labs Reviewed - No data to display  EKG None  Radiology Dg Chest 2 View  Result Date: 09/01/2018 CLINICAL DATA:  Postnasal drip, productive cough and runny nose since Saturday EXAM: CHEST - 2 VIEW COMPARISON:  09/15/2013 FINDINGS: Normal heart size, mediastinal contours, and  pulmonary vascularity. Lungs clear. No infiltrate, pleural effusion or pneumothorax. Osseous structures unremarkable. IMPRESSION: No acute abnormalities. Electronically Signed   By: Ulyses Southward M.D.   On: 09/01/2018 12:06    Procedures Procedures (including critical care time)  Medications Ordered in ED Medications - No data to display   Initial Impression / Assessment and Plan / ED Course  I have reviewed the triage vital signs and the nursing notes.  Pertinent labs & imaging results that were available during my care of the patient were reviewed by me and considered in my medical decision making (see chart for details).  32 year old female who appears otherwise well presents for evaluation of cough, rhinorrhea and nasal congestion.  Symptom onset 48 hours ago.  Denies sick contacts.  Afebrile, nonseptic, non-ill-appearing.  Lungs clear to auscultation bilaterally.  Oxygen saturations 97% on room air with good waveform.  No phonation changes.  Able to tolerate PO intake without difficulty. Able to speak in full sentences without difficulty.  She does not appear in any acute respiratory distress.  Clear rhinorrhea to bilateral nares. Tonsils without edema or exudate.  No frontal sinus pressure or pain. Mild tachycardia in triage to 103. Patient is not tachycardic or tachpnea on my exam, HR 83, RR 17. Wells criteria negative, no exogenous hormone use, unilateral leg swelling, hemoptysis, recent surgery, or immobilization. Plain film negative for infiltrates, pneumothorax, CHF, pulmonary edema.  Patient likely viral upper respiratory infection.  Discussed with patient she does not need antibiotics for this.  Will dc with symptomatic management.  Discussed follow-up with PCP for reevaluation.  Patient is hemodynamically stable and appropriate for DC home at this time.  Patient voiced understanding of return precautions and agreeable for follow-up.    Final Clinical Impressions(s) / ED Diagnoses    Final diagnoses:  Cough  Rhinorrhea    ED Discharge Orders         Ordered    fluticasone (FLONASE) 50 MCG/ACT nasal spray  Daily     09/01/18 1249    guaifenesin (ROBITUSSIN) 100 MG/5ML syrup  Every 4  hours PRN     09/01/18 1249           Dhwani Venkatesh A, PA-C 09/01/18 1302    Rolan Bucco, MD 09/01/18 1343

## 2018-09-13 ENCOUNTER — Encounter (HOSPITAL_BASED_OUTPATIENT_CLINIC_OR_DEPARTMENT_OTHER): Payer: Self-pay | Admitting: Adult Health

## 2018-09-13 ENCOUNTER — Other Ambulatory Visit: Payer: Self-pay

## 2018-09-13 ENCOUNTER — Emergency Department (HOSPITAL_BASED_OUTPATIENT_CLINIC_OR_DEPARTMENT_OTHER)
Admission: EM | Admit: 2018-09-13 | Discharge: 2018-09-13 | Disposition: A | Discharge status: Home / Self Care | Payer: Medicaid Other | Attending: Emergency Medicine | Admitting: Emergency Medicine

## 2018-09-13 DIAGNOSIS — Z79899 Other long term (current) drug therapy: Secondary | ICD-10-CM | POA: Diagnosis not present

## 2018-09-13 DIAGNOSIS — K1379 Other lesions of oral mucosa: Secondary | ICD-10-CM | POA: Insufficient documentation

## 2018-09-13 DIAGNOSIS — J069 Acute upper respiratory infection, unspecified: Secondary | ICD-10-CM | POA: Insufficient documentation

## 2018-09-13 DIAGNOSIS — J029 Acute pharyngitis, unspecified: Secondary | ICD-10-CM | POA: Diagnosis present

## 2018-09-13 LAB — GROUP A STREP BY PCR: Group A Strep by PCR: NOT DETECTED

## 2018-09-13 MED ORDER — LIDOCAINE VISCOUS HCL 2 % MT SOLN
15.0000 mL | Freq: Once | OROMUCOSAL | Status: AC
  Administered 2018-09-13: 15 mL via OROMUCOSAL

## 2018-09-13 MED ORDER — DEXAMETHASONE 10 MG/ML FOR PEDIATRIC ORAL USE
INTRAMUSCULAR | Status: AC
  Administered 2018-09-13: 10 mg
  Filled 2018-09-13: qty 1

## 2018-09-13 MED ORDER — DEXAMETHASONE 1 MG/ML PO CONC
10.0000 mg | Freq: Once | ORAL | Status: DC
  Filled 2018-09-13: qty 10

## 2018-09-13 MED ORDER — IBUPROFEN 100 MG/5ML PO SUSP
400.0000 mg | Freq: Once | ORAL | Status: AC
  Administered 2018-09-13: 400 mg via ORAL
  Filled 2018-09-13: qty 20

## 2018-09-13 MED ORDER — MAGIC MOUTHWASH W/LIDOCAINE: 10.0000 mL | Freq: Three times a day (TID) | ORAL | 0 refills | Status: DC

## 2018-09-13 MED ORDER — LIDOCAINE VISCOUS HCL 2 % MT SOLN
OROMUCOSAL | Status: AC
  Administered 2018-09-13: 15 mL via OROMUCOSAL
  Filled 2018-09-13: qty 15

## 2018-09-13 NOTE — Discharge Instructions (Signed)
You have uvula edema.  This is likely due to your viral upper respiratory infection leading to snoring at night which irritates her uvula.  It is enlarged and this is what you feel when you are swallowing.  You can use the Magic mouthwash and ibuprofen as needed for pain.  Return if you have difficulty breathing, choking, nonresolution within 1 week.

## 2018-09-13 NOTE — ED Provider Notes (Signed)
[facility] EMERGENCY DEPARTMENT Provider Note   CSN: [id number] Arrival date & time: 09/13/18  [id number]     History   Chief Complaint No chief complaint on file.   HPI Tracey Leonard is a 32 y.o. female.  Patient states that the pain is so bad that she cannot swallow pills.  Patient said that she was snoring all night and that when she woke up this morning it was difficult for her to swallow.  The history is provided by the patient.  Sore Throat  This is a new problem. The current episode started 1 to 2 hours ago. The problem occurs constantly. The problem has not changed since onset.Pertinent negatives include no chest pain, no abdominal pain, no headaches and no shortness of breath. Associated symptoms comments: Cough. The symptoms are aggravated by swallowing, eating and coughing. Nothing relieves the symptoms. She has tried acetaminophen for the symptoms. The treatment provided no relief.    Past Medical History:  Diagnosis Date  . Cardiac murmur    seen by Dr. Charlton HawsPeter Nishan 05/2015; echo ordered, no record of echo having been done. Next appt 11/22/16  . Chronic headaches   . Dorsal wrist ganglion 01/2016   right  . Low blood potassium     Patient Active Problem List   Diagnosis Date Noted  . Seasonal and perennial allergic rhinitis 05/31/2017  . Nummular eczema 05/31/2017  . Chronic cholecystitis with calculus 10/13/2013  . Gallstones 09/26/2013  . Vaginal delivery 03/23/2013  . Second-degree perineal laceration, with delivery 03/23/2013  . Active labor at term 03/22/2013  . Marginal insertion of umbilical cord - EFW q4wks starting at 24wks 12/02/2012  . Pregnant state, incidental 08/28/2012  . 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 First trimester bleeding 08/07/2012  . Vaginal discharge 08/07/2012    Past Surgical History:  Procedure Laterality Date  . NO PAST SURGERIES       OB History    Gravida  1   Para  1   Term  1   Preterm     AB      Living  1     SAB      TAB      Ectopic      Multiple      Live Births  1            Home Medications    Prior to Admission medications   Medication Sig Start Date End Date Taking? Authorizing Provider  calcium carbonate (TUMS - DOSED IN MG ELEMENTAL CALCIUM) 500 MG chewable tablet Chew 1 tablet by mouth daily.    [provider]  cetirizine (ZYRTEC ALLERGY) 10 MG tablet Take 1 tablet (10 mg total) by mouth daily. 05/31/17   Alfonse SpruceGallagher, Joel Louis, MD  Clobetasol Propionate 0.025 % CREA Apply 1 application topically 2 (two) times daily as needed. Do not use longer than 14 days. 09/27/17   Alfonse SpruceGallagher, Joel Louis, MD  Crisaborole (EUCRISA) 2 % OINT Apply 1 application topically 2 (two) times daily as needed. 09/27/17   Alfonse SpruceGallagher, Joel Louis, MD  fluticasone [facility](FLONASE) 50 MCG/ACT nasal spray Place 1 spray into both nostrils daily. 09/01/18   Henderly, Britni A, PA-C  guaifenesin (ROBITUSSIN) 100 MG/5ML syrup Take 5-10 mLs (100-200 mg total) by mouth every 4 (four) hours as needed for cough. 09/01/18   Henderly, Britni A, PA-C  montelukast (SINGULAIR) 10 MG tablet Take 1 tablet (10 mg total) by mouth at bedtime. 05/31/17   Malachi BondsGallagher, Joel  Louis, MD  Multiple Vitamins-Minerals (MULTIPLE VITAMINS/WOMENS PO) Take by mouth.    [provider]  Prenatal Vit-Fe Phos-FA-Omega (VITAFOL GUMMIES) 3.33-0.333-34.8 MG CHEW Chew 3 tablets by mouth daily before breakfast. 11/29/17   Brock Bad, MD  TRI-LO-SPRINTEC 0.18/0.215/0.25 MG-25 MCG tab TK 1 T PO QD 09/03/17   [provider]    Family History Family History  Problem Relation Age of Onset  . Other Father        MVA    Social History Social History   Tobacco Use  . Smoking status: Never Smoker  . Smokeless tobacco: Never Used  Substance Use Topics  . Alcohol use: No    Alcohol/week: 0.0 standard drinks  . Drug use: No     Allergies   Amoxicillin   Review of Systems Review of  Systems  Respiratory: Negative for shortness of breath.   Cardiovascular: Negative for chest pain.  Gastrointestinal: Negative for abdominal pain.  Neurological: Negative for headaches.     Physical Exam Updated Vital Signs BP 132/72 (BP Location: Left Arm)   Pulse 88   Temp 98.9 F (37.2 C) (Oral)   Resp 18   Ht  (1.626 m)   Wt 77.1 kg   SpO2 100%   BMI 29.18 kg/m   Physical Exam Constitutional:      Appearance: Normal appearance.  HENT:     Head: Normocephalic and atraumatic.     Nose: Congestion present.     Mouth/Throat:     Pharynx: Oropharyngeal exudate and uvula swelling present.  Eyes:     General: No scleral icterus. Neck:     Musculoskeletal: Normal range of motion and neck supple. No neck rigidity.  Cardiovascular:     Rate and Rhythm: Normal rate and regular rhythm.  Pulmonary:     Effort: Pulmonary effort is normal.     Breath sounds: Normal breath sounds.  Abdominal:     General: Abdomen is flat.     Palpations: Abdomen is soft.  Lymphadenopathy:     Cervical: No cervical adenopathy.  Skin:    General: Skin is warm and dry.  Neurological:     General: No focal deficit present.     Mental Status: She is alert.  Psychiatric:        Mood and Affect: Mood normal.        Behavior: Behavior normal.      ED Treatments / Results  Labs (all labs ordered are listed, but only abnormal results are displayed) Labs Reviewed  GROUP A STREP BY PCR    EKG None  Radiology No results found.  Procedures Procedures (including critical care time)  Medications Ordered in ED Medications  dexamethasone (DECADRON) 1 MG/ML solution 10 mg (10 mg Oral Not Given 09/13/18 1011)  ibuprofen (ADVIL,MOTRIN) 100 MG/5ML suspension 400 mg (400 mg Oral Given 09/13/18 0951)  lidocaine (XYLOCAINE) 2 % viscous mouth solution 15 mL (15 mLs Mouth/Throat Given 09/13/18 0952)  dexamethasone (DECADRON) 10 MG/ML injection for Pediatric ORAL use (10 mg  Given 09/13/18  1610)     Initial Impression / Assessment and Plan / ED Course  I have reviewed the triage vital signs and the nursing notes.  Pertinent labs & imaging results that were available during my care of the patient were reviewed by me and considered in my medical decision making (see chart for details).     Patient presenting with sore throat with uvula edema. Likely viral URI leading to mouth breathing overnight  leading to uvula irritation. Rapid strep negative.  Given ibuprofen and decadron.  Patient may use Magic mouthwash as needed and ibuprofen for sore throat irritation.  Final Clinical Impressions(s) / ED Diagnoses   Final diagnoses:  Viral URI  Uvular edema    ED Discharge Orders    None       Garnette Gunner, MD 09/13/18 1026    Gwyneth Sprout, MD 09/13/18 1225

## 2018-09-13 NOTE — ED Triage Notes (Signed)
PREsents with sore thraot began today. WOuld like to be tested for strep. She reports that she had difficulty swallowing due to pain

## 2018-10-07 ENCOUNTER — Encounter: Payer: Self-pay | Admitting: Obstetrics and Gynecology

## 2018-10-07 ENCOUNTER — Ambulatory Visit: Payer: Medicaid Other | Admitting: Obstetrics and Gynecology

## 2018-10-07 VITALS — BP 121/82 | HR 96 | Ht 64.0 in | Wt 174.0 lb

## 2018-10-07 DIAGNOSIS — Z3169 Encounter for other general counseling and advice on procreation: Secondary | ICD-10-CM

## 2018-10-07 MED ORDER — PRENATAL VITAMINS 0.8 MG PO TABS: 1.0000 | ORAL_TABLET | Freq: Every day | ORAL | 12 refills | Status: AC

## 2018-10-07 NOTE — Progress Notes (Signed)
33 yo G1P1 here for infertility evaluation. Patient reports a monthly 5 day period occuring every 28-29 days. She denies pelvic pain or abnormal discharge. Patient reports partner had a recent sperm evaluation and was told he had a low sperm count and impaired motility. Patient is actively trying to conceive and desires some assistance  Past Medical History:  Diagnosis Date  . Cardiac murmur    seen by Dr. Charlton Haws 05/2015; echo ordered, no record of echo having been done. Next appt 11/22/16  . Chronic headaches   . Dorsal wrist ganglion 01/2016   right  . Low blood potassium    Past Surgical History:  Procedure Laterality Date  . NO PAST SURGERIES     Family History  Problem Relation Age of Onset  . Other Father        MVA   Social History   Tobacco Use  . Smoking status: Never Smoker  . Smokeless tobacco: Never Used  Substance Use Topics  . Alcohol use: No    Alcohol/week: 0.0 standard drinks  . Drug use: No   ROS See pertinent in HPI Blood pressure 121/82, pulse 96, height 5\' 4"  (1.626 m), weight 174 lb (78.9 kg), last menstrual period 09/28/2018. GENERAL: Well-developed, well-nourished female in no acute distress.  NEURO: Alert and oriented x 3  A/P 33 yo P1 with desire for fertility - Patient referred to infertility for possible IUI - Patient advised to bring partner's records - ovulation calendar and calculation reviewed with the patient - Normal pap smear 11/2017 - Advised to take prenatal vitamins

## 2018-10-07 NOTE — Progress Notes (Signed)
Patient is in the office for fertility check, pt states that she is actively trying to get pregnant. Pt states that her partner was told that his sperm count is low, however [facility] will not see her until she is checked by our office first.

## 2018-10-10 ENCOUNTER — Telehealth: Payer: Self-pay

## 2018-10-10 NOTE — Telephone Encounter (Signed)
Returned call, pt wants transvaginal U/S done here to try and get insurance to pay for it, so that she can take U/S report to [facility]. Advised that office manager stated that this will not be covered by medicaid, and that we will need a more detailed explanation of what the U/S is looking for.

## 2018-10-15 ENCOUNTER — Other Ambulatory Visit (HOSPITAL_BASED_OUTPATIENT_CLINIC_OR_DEPARTMENT_OTHER): Payer: Self-pay

## 2018-10-15 DIAGNOSIS — G4733 Obstructive sleep apnea (adult) (pediatric): Secondary | ICD-10-CM

## 2018-10-21 ENCOUNTER — Telehealth: Payer: Self-pay

## 2018-10-21 NOTE — Telephone Encounter (Signed)
Error

## 2018-11-26 ENCOUNTER — Ambulatory Visit (HOSPITAL_BASED_OUTPATIENT_CLINIC_OR_DEPARTMENT_OTHER): Payer: Medicaid Other

## 2018-12-02 ENCOUNTER — Ambulatory Visit: Payer: Medicaid Other | Admitting: Obstetrics

## 2018-12-11 ENCOUNTER — Ambulatory Visit: Payer: Medicaid Other | Admitting: Obstetrics

## 2019-01-06 ENCOUNTER — Other Ambulatory Visit: Payer: Self-pay | Admitting: Obstetrics

## 2019-01-06 DIAGNOSIS — Z3169 Encounter for other general counseling and advice on procreation: Secondary | ICD-10-CM

## 2019-01-12 ENCOUNTER — Encounter (HOSPITAL_BASED_OUTPATIENT_CLINIC_OR_DEPARTMENT_OTHER): Payer: Medicaid Other

## 2019-02-05 ENCOUNTER — Telehealth: Payer: Self-pay

## 2019-02-05 NOTE — Telephone Encounter (Signed)
This patient called in to ask if she could get a Hysterosalpingogram done, her partner is going through a fertility clinic for testing and his doctor suggested to the patient that she should have an HSG done. What should I tell the patient? Does she need a virtual appointment with a provider to talk about this?

## 2019-02-06 ENCOUNTER — Encounter: Payer: Self-pay | Admitting: Obstetrics

## 2019-02-08 ENCOUNTER — Other Ambulatory Visit: Payer: Self-pay | Admitting: Obstetrics and Gynecology

## 2019-02-08 DIAGNOSIS — Z3169 Encounter for other general counseling and advice on procreation: Secondary | ICD-10-CM

## 2019-02-11 ENCOUNTER — Ambulatory Visit: Payer: Medicaid Other | Admitting: Obstetrics

## 2019-02-25 ENCOUNTER — Ambulatory Visit: Payer: Medicaid Other | Admitting: Obstetrics

## 2019-05-19 ENCOUNTER — Encounter

## 2019-11-14 ENCOUNTER — Other Ambulatory Visit (HOSPITAL_COMMUNITY)
Admission: RE | Admit: 2019-11-14 | Discharge: 2019-11-14 | Disposition: A | Discharge status: Home / Self Care | Payer: Medicaid Other | Source: Ambulatory Visit | Attending: Obstetrics | Admitting: Obstetrics

## 2019-11-14 ENCOUNTER — Encounter: Payer: Self-pay | Admitting: Obstetrics

## 2019-11-14 ENCOUNTER — Other Ambulatory Visit: Payer: Self-pay

## 2019-11-14 ENCOUNTER — Ambulatory Visit: Payer: Medicaid Other | Admitting: Obstetrics

## 2019-11-14 VITALS — BP 132/82 | HR 81 | Ht 64.0 in | Wt 181.0 lb

## 2019-11-14 DIAGNOSIS — Z3169 Encounter for other general counseling and advice on procreation: Secondary | ICD-10-CM

## 2019-11-14 DIAGNOSIS — N898 Other specified noninflammatory disorders of vagina: Secondary | ICD-10-CM

## 2019-11-14 DIAGNOSIS — Z01419 Encounter for gynecological examination (general) (routine) without abnormal findings: Secondary | ICD-10-CM

## 2019-11-14 DIAGNOSIS — Z3009 Encounter for other general counseling and advice on contraception: Secondary | ICD-10-CM | POA: Diagnosis not present

## 2019-11-14 MED ORDER — VITAFOL GUMMIES 3.33-0.333-34.8 MG PO CHEW: 1.0000 | CHEWABLE_TABLET | Freq: Every day | ORAL | 11 refills | Status: AC

## 2019-11-14 NOTE — Progress Notes (Signed)
Subjective:        Tracey Leonard is a 34 y.o. female here for a routine exam.  Current complaints: None.    Personal health questionnaire:  Is patient Ashkenazi Jewish, have a family history of breast and/or ovarian cancer: no Is there a family history of uterine cancer diagnosed at age < 66, gastrointestinal cancer, urinary tract cancer, family member who is a Personnel officer syndrome-associated carrier: no Is the patient overweight and hypertensive, family history of diabetes, personal history of gestational diabetes, preeclampsia or PCOS: no Is patient over 19, have PCOS,  family history of premature CHD under age 54, diabetes, smoke, have hypertension or peripheral artery disease:  no At any time, has a partner hit, kicked or otherwise hurt or frightened you?: no Over the past 2 weeks, have you felt down, depressed or hopeless?: no Over the past 2 weeks, have you felt little interest or pleasure in doing things?:no   Gynecologic History Patient's last menstrual period was 10/29/2019. Contraception: none Last Pap: 2021. Results were: normal Last mammogram: n/a. Results were: n/a  Obstetric History OB History  Gravida Para Term Preterm AB Living  1 1 1     1   SAB TAB Ectopic Multiple Live Births          1    # Outcome Date GA Lbr Len/2nd Weight Sex Delivery Anes PTL Lv  1 Term 03/22/13 [redacted]w[redacted]d 04:34 / 00:23 6 lb 2.2 oz (2.784 kg) F Vag-Spont EPI  LIV    Past Medical History:  Diagnosis Date  . Cardiac murmur    seen by Dr. [redacted]w[redacted]d 05/2015; echo ordered, no record of echo having been done. Next appt 11/22/16  . Chronic headaches   . Dorsal wrist ganglion 01/2016   right  . Low blood potassium     Past Surgical History:  Procedure Laterality Date  . NO PAST SURGERIES       Current Outpatient Medications:  .  Prenatal Multivit-Min-Fe-FA (PRENATAL VITAMINS) 0.8 MG tablet, Take 1 tablet by mouth daily., Disp: 30 tablet, Rfl: 12 .  calcium carbonate (TUMS - DOSED IN MG  ELEMENTAL CALCIUM) 500 MG chewable tablet, Chew 1 tablet by mouth daily., Disp: , Rfl:  .  cetirizine (ZYRTEC ALLERGY) 10 MG tablet, Take 1 tablet (10 mg total) by mouth daily. (Patient not taking: Reported on 10/07/2018), Disp: 30 tablet, Rfl: 5 .  Clobetasol Propionate 0.025 % CREA, Apply 1 application topically 2 (two) times daily as needed. Do not use longer than 14 days. (Patient not taking: Reported on 10/07/2018), Disp: 60 g, Rfl: 0 .  Crisaborole (EUCRISA) 2 % OINT, Apply 1 application topically 2 (two) times daily as needed. (Patient not taking: Reported on 11/14/2019), Disp: 60 g, Rfl: 3 .  fluticasone (FLONASE) 50 MCG/ACT nasal spray, Place 1 spray into both nostrils daily. (Patient not taking: Reported on 10/07/2018), Disp: 11.1 g, Rfl: 2 .  guaifenesin (ROBITUSSIN) 100 MG/5ML syrup, Take 5-10 mLs (100-200 mg total) by mouth every 4 (four) hours as needed for cough. (Patient not taking: Reported on 10/07/2018), Disp: 60 mL, Rfl: 0 .  magic mouthwash w/lidocaine SOLN, Take 10 mLs by mouth 3 (three) times daily. (Patient not taking: Reported on 10/07/2018), Disp: 80 mL, Rfl: 0 .  montelukast (SINGULAIR) 10 MG tablet, Take 1 tablet (10 mg total) by mouth at bedtime. (Patient not taking: Reported on 10/07/2018), Disp: 30 tablet, Rfl: 5 .  Multiple Vitamins-Minerals (MULTIPLE VITAMINS/WOMENS PO), Take by mouth., Disp: , Rfl:  .  Prenatal Vit-Fe Phos-FA-Omega (VITAFOL GUMMIES) 3.33-0.333-34.8 MG CHEW, CHEW AND SWALLOW 3 TABLETS BY MOUTH DAILY BEFORE BREAKFAST (Patient not taking: Reported on 11/14/2019), Disp: 90 tablet, Rfl: 4 .  TRI-LO-SPRINTEC 0.18/0.215/0.25 MG-25 MCG tab, TK 1 T PO QD, Disp: , Rfl: 5 Allergies  Allergen Reactions  . Amoxicillin Rash    Social History   Tobacco Use  . Smoking status: Never Smoker  . Smokeless tobacco: Never Used  Substance Use Topics  . Alcohol use: No    Alcohol/week: 0.0 standard drinks    Family History  Problem Relation Age of Onset  . Other Father         MVA      Review of Systems  Constitutional: negative for fatigue and weight loss Respiratory: negative for cough and wheezing Cardiovascular: negative for chest pain, fatigue and palpitations Gastrointestinal: negative for abdominal pain and change in bowel habits Musculoskeletal:negative for myalgias Neurological: negative for gait problems and tremors Behavioral/Psych: negative for abusive relationship, depression Endocrine: negative for temperature intolerance    Genitourinary:negative for abnormal menstrual periods, genital lesions, hot flashes, sexual problems and vaginal discharge Integument/breast: negative for breast lump, breast tenderness, nipple discharge and skin lesion(s)    Objective:       BP 132/82   Pulse 81   Ht 5\' 4"  (1.626 m)   Wt 181 lb (82.1 kg)   LMP 10/29/2019   BMI 31.07 kg/m  General:   alert  Skin:   no rash or abnormalities  Lungs:   clear to auscultation bilaterally  Heart:   regular rate and rhythm, S1, S2 normal, no murmur, click, rub or gallop  Breasts:   normal without suspicious masses, skin or nipple changes or axillary nodes  Abdomen:  normal findings: no organomegaly, soft, non-tender and no hernia  Pelvis:  External genitalia: normal general appearance Urinary system: urethral meatus normal and bladder without fullness, nontender Vaginal: normal without tenderness, induration or masses Cervix: normal appearance Adnexa: normal bimanual exam Uterus: anteverted and non-tender, normal size   Lab Review Urine pregnancy test Labs reviewed yes Radiologic studies reviewed yes  50% of 25 min visit spent on counseling and coordination of care.   Assessment:     1. Encounter for gynecological examination Rx: - HIV antibody (with reflex) - RPR - Hepatitis B Surface AntiGEN - Hepatitis C Antibody  2. Vaginal discharge Rx: - Cervicovaginal ancillary only( [place])  3. Encounter for other general counseling or advice on  contraception - try to conceive - folic acid recommended prior to pregnancy  4. Encounter for preconception consultation Rx: - Prenatal Vit-Fe Phos-FA-Omega (VITAFOL GUMMIES) 3.33-0.333-34.8 MG CHEW; Chew 1 tablet by mouth daily before breakfast.  Dispense: 90 tablet; Refill: 11    Plan:    Education reviewed: calcium supplements, depression evaluation, low fat, low cholesterol diet, safe sex/STD prevention, self breast exams and weight bearing exercise. Contraception: none. Follow up in: 1 year.    Orders Placed This Encounter  Procedures  . HIV antibody (with reflex)  . RPR  . Hepatitis B Surface AntiGEN  . Hepatitis C Antibody    Shelly Bombard, MD 11/14/2019 8:41 AM

## 2019-11-14 NOTE — Progress Notes (Signed)
Patient is in the office for annual, last pap 10-06-19, pt states that she had annual at [facility]. Pt denies any abnormal symptoms, declines BC, LMP 10-29-19.

## 2019-11-15 LAB — HIV ANTIBODY (ROUTINE TESTING W REFLEX): HIV Screen 4th Generation wRfx: NONREACTIVE

## 2019-11-15 LAB — HEPATITIS B SURFACE ANTIGEN: Hepatitis B Surface Ag: NEGATIVE

## 2019-11-15 LAB — HEPATITIS C ANTIBODY: Hep C Virus Ab: <0.1 s/co ratio (ref 0.0–0.9)

## 2019-11-15 LAB — RPR: RPR Ser Ql: NONREACTIVE

## 2019-11-18 ENCOUNTER — Other Ambulatory Visit: Payer: Self-pay | Admitting: Obstetrics

## 2019-11-18 ENCOUNTER — Other Ambulatory Visit: Payer: Self-pay

## 2019-11-18 DIAGNOSIS — B9689 Other specified bacterial agents as the cause of diseases classified elsewhere: Secondary | ICD-10-CM

## 2019-11-18 DIAGNOSIS — N760 Acute vaginitis: Secondary | ICD-10-CM

## 2019-11-18 LAB — CERVICOVAGINAL ANCILLARY ONLY
Bacterial Vaginitis (gardnerella): POSITIVE — AB
Candida Glabrata: NEGATIVE
Candida Vaginitis: NEGATIVE
Comment: NEGATIVE
Comment: NEGATIVE
Comment: NEGATIVE

## 2019-11-18 MED ORDER — SOLOSEC 2 G PO PACK: 1.0000 | PACK | Freq: Once | ORAL | 2 refills | Status: AC

## 2019-11-18 MED ORDER — METRONIDAZOLE 0.75 % VA GEL: 1.0000 | Freq: Every day | VAGINAL | 1 refills | Status: DC

## 2021-09-18 NOTE — L&D Delivery Note (Signed)
DELIVERY NOTE  Pt complete and at +2 station with urge to push. Epidural controlling pain. Pt pushed and delivered a viable female infant in LOA position. Double nuchal, loose, reduced at perineum. Anterior and posterior shoulders spontaneously delivered with next two pushes; body easily followed next. Infant placed on mothers abdomen and bulb suction of mouth and nose performed. Cord was then clamped and cut by patient's mother. Cord blood obtained, 3VC. Baby had a vigorous spontaneous cry noted. Placenta then delivered at 2026 intact. Fundal massage performed and pitocin per protocol. Fundus firm. The following lacerations were noted: NONE, EBL 300cc. Mother and baby stable. Counts correct   Infant time: 2023 Gender: female, desires circ Placenta time: 2026, to pathology Apgars: 8/9 Weight: pending skin-to-skin

## 2021-11-28 LAB — OB RESULTS CONSOLE RUBELLA ANTIBODY, IGM: Rubella: IMMUNE

## 2021-11-28 LAB — OB RESULTS CONSOLE HEPATITIS B SURFACE ANTIGEN: Hepatitis B Surface Ag: NEGATIVE

## 2021-11-28 LAB — HEPATITIS C ANTIBODY: HCV Ab: NEGATIVE

## 2021-11-28 LAB — OB RESULTS CONSOLE RPR: RPR: NONREACTIVE

## 2021-11-28 LAB — OB RESULTS CONSOLE ABO/RH: RH Type: POSITIVE

## 2021-11-28 LAB — OB RESULTS CONSOLE GC/CHLAMYDIA
Chlamydia: NEGATIVE
Neisseria Gonorrhea: NEGATIVE

## 2021-11-28 LAB — OB RESULTS CONSOLE HIV ANTIBODY (ROUTINE TESTING): HIV: NONREACTIVE

## 2021-11-28 LAB — OB RESULTS CONSOLE ANTIBODY SCREEN: Antibody Screen: NEGATIVE

## 2021-12-09 ENCOUNTER — Encounter: Payer: Medicaid Other | Admitting: Obstetrics and Gynecology

## 2021-12-19 NOTE — Progress Notes (Signed)
CARDIOLOGY CONSULT NOTE  ? ? ? ? ? ?Patient ID: ?Tracey Leonard ?MRN: [id number] ?DOB/AGE: [birth year] 34 y.o. ? ?Admit date: (Not on file) ?Referring Physician: Meisinger ?Primary Physician: Patient, No Pcp Per (Inactive) ?Primary Cardiologist: New ?Reason for Consultation: Murmur ? ? ?HPI:  36 y.o. referred by her OB/GYN Dr Jackelyn Knife for heart murmur  She has had some issues with vaginal bleeding in past. She is on no cardiac meds. She denies history of rheumatic dx. She had an echo done 06/12/16 for murmur and EF was normal with normal valves This was done at [facility] in setting of cholangitis CXR done 09/01/18 showed normal cardiac [place] with no CE ? ?I saw her in 2018 for same see above TTE She has not used diet pills and no congenital heart dx. Has a daughter now 55 years old with laryngomalacia I noted 1/6 SEM and echo had no valve dx ? ?She is in her first trimester with no dyspnea, palpitations, chest pain syncope or edema ?She is still working at [employer]  ? ?ROS ?All other systems reviewed and negative except as noted above ? ?Past Medical History:  ?Diagnosis Date  ? Cardiac murmur   ? seen by Dr. Charlton Haws 05/2015; echo ordered, no record of echo having been done. Next appt 11/22/16  ? Chronic headaches   ? Dorsal wrist ganglion 01/2016  ? right  ? Low blood potassium   ?  ?Family History  ?Problem Relation Age of Onset  ? Other Father   ?     MVA  ?  ?Social History  ? ?Socioeconomic History  ? Marital status: Single  ?  Spouse name: Not on file  ? Number of children: Not on file  ? Years of education: 70  ? Highest education level: Not on file  ?Occupational History  ? Occupation: ASSEMBLY AT [employer]  ?  Employer: [employer]  ?Tobacco Use  ? Smoking status: Never  ? Smokeless tobacco: Never  ?Vaping Use  ? Vaping Use: Never used  ?Substance and Sexual Activity  ? Alcohol use: No  ?  Alcohol/week: 0.0 standard drinks  ? Drug use: No  ? Sexual activity: Not Currently  ?  Partners: Male  ?  Birth  control/protection: None  ?Other Topics Concern  ? Not on file  ?Social History Narrative  ? Not on file  ? ?Social Determinants of Health  ? ?Financial Resource Strain: Not on file  ?Food Insecurity: Not on file  ?Transportation Needs: Not on file  ?Physical Activity: Not on file  ?Stress: Not on file  ?Social Connections: Not on file  ?Intimate Partner Violence: Not on file  ?  ?Past Surgical History:  ?Procedure Laterality Date  ? NO PAST SURGERIES    ?  ? ? ?Current Outpatient Medications:  ?  calcium carbonate (TUMS - DOSED IN MG ELEMENTAL CALCIUM) 500 MG chewable tablet, Chew 1 tablet by mouth daily., Disp: , Rfl:  ?  cetirizine (ZYRTEC ALLERGY) 10 MG tablet, Take 1 tablet (10 mg total) by mouth daily., Disp: 30 tablet, Rfl: 5 ?  montelukast (SINGULAIR) 10 MG tablet, Take 1 tablet (10 mg total) by mouth at bedtime., Disp: 30 tablet, Rfl: 5 ?  Prenatal Multivit-Min-Fe-FA (PRENATAL VITAMINS) 0.8 MG tablet, Take 1 tablet by mouth daily., Disp: 30 tablet, Rfl: 12 ?  Prenatal Vit-Fe Phos-FA-Omega (VITAFOL GUMMIES) 3.33-0.333-34.8 MG CHEW, Chew 1 tablet by mouth daily before breakfast., Disp: 90 tablet, Rfl: 11 ?  TRI-LO-SPRINTEC 0.18/0.215/0.25 MG-25 MCG tab,  TK 1 T PO QD, Disp: , Rfl: 5 ?  Clobetasol Propionate 0.025 % CREA, Apply 1 application topically 2 (two) times daily as needed. Do not use longer than 14 days. (Patient not taking: Reported on 10/07/2018), Disp: 60 g, Rfl: 0 ?  Crisaborole (EUCRISA) 2 % OINT, Apply 1 application topically 2 (two) times daily as needed. (Patient not taking: Reported on 11/14/2019), Disp: 60 g, Rfl: 3 ?  fluticasone (FLONASE) 50 MCG/ACT nasal spray, Place 1 spray into both nostrils daily. (Patient not taking: Reported on 10/07/2018), Disp: 11.1 g, Rfl: 2 ?  guaifenesin (ROBITUSSIN) 100 MG/5ML syrup, Take 5-10 mLs (100-200 mg total) by mouth every 4 (four) hours as needed for cough. (Patient not taking: Reported on 10/07/2018), Disp: 60 mL, Rfl: 0 ?  magic mouthwash w/lidocaine  SOLN, Take 10 mLs by mouth 3 (three) times daily. (Patient not taking: Reported on 10/07/2018), Disp: 80 mL, Rfl: 0 ?  metroNIDAZOLE (METROGEL) 0.75 % vaginal gel, Place 1 Applicatorful vaginally at bedtime. Apply one applicatorful to vagina at bedtime for 5 days (Patient not taking: Reported on 12/23/2021), Disp: 70 g, Rfl: 1 ?  Multiple Vitamins-Minerals (MULTIPLE VITAMINS/WOMENS PO), Take by mouth. (Patient not taking: Reported on 12/23/2021), Disp: , Rfl:  ? ? ? ?Physical Exam: ?Blood pressure 112/72, pulse 97, height 5\' 3"  (1.6 m), weight 186 lb (84.4 kg), SpO2 100 %.   ? ?Affect appropriate ?Healthy:  appears stated age ?HEENT: normal ?Neck supple with no adenopathy ?JVP normal no bruits no thyromegaly ?Lungs clear with no wheezing and good diaphragmatic motion ?Heart:  S1/S2 1/6/ SEM  murmur, no rub, gallop or click ?PMI normal ?Abdomen: benighn, BS positve, no tenderness, no AAA ?no bruit.  No HSM or HJR ?Distal pulses intact with no bruits ?No edema ?Neuro non-focal ?Skin warm and dry ?No muscular weakness ? ? ?Labs: ?  ?Lab Results  ?Component Value Date  ? WBC 4.2 10/18/2016  ? HGB 11.5 (L) 10/18/2016  ? HCT 36.3 10/18/2016  ? MCV 79.8 10/18/2016  ? PLT 239 10/18/2016  ? No results for input(s): NA, K, CL, CO2, BUN, CREATININE, CALCIUM, PROT, BILITOT, ALKPHOS, ALT, AST, GLUCOSE in the last 168 hours. ? ?Invalid input(s): LABALBU ?No results found for: CKTOTAL, CKMB, CKMBINDEX, TROPONINI No results found for: CHOL ?No results found for: HDL ?No results found for: LDLCALC ?No results found for: TRIG ?No results found for: CHOLHDL ?No results found for: LDLDIRECT  ?  ?Radiology: ?No results found. ? ?EKG: NSR rate 79 normal ECG 12/23/2021  ? ? ?ASSESSMENT AND PLAN:  ? ?Murmur:  chronic benign sounding 1/6 SEM Echo 2017 ok will update  ?Allergies:  Using Singulair and Zyrtec  ?Pregnancy:  f/u Meisinger no complications gaining some weight  ? ?TTE ? ?F/U cardiology PRN ? ?Signed: ?2018 ?12/23/2021, 9:32  AM ? ? ?

## 2021-12-23 ENCOUNTER — Ambulatory Visit (INDEPENDENT_AMBULATORY_CARE_PROVIDER_SITE_OTHER): Payer: Medicaid Other | Admitting: Cardiovascular Disease

## 2021-12-23 ENCOUNTER — Encounter: Payer: Self-pay | Admitting: Cardiovascular Disease

## 2021-12-23 VITALS — BP 112/72 | HR 97 | Ht 63.0 in | Wt 186.0 lb

## 2021-12-23 DIAGNOSIS — R011 Cardiac murmur, unspecified: Secondary | ICD-10-CM | POA: Diagnosis not present

## 2021-12-23 NOTE — Patient Instructions (Signed)
Medication Instructions:  °Your physician recommends that you continue on your current medications as directed. Please refer to the Current Medication list given to you today. ° °*If you need a refill on your cardiac medications before your next appointment, please call your pharmacy* ° °Lab Work: °If you have labs (blood work) drawn today and your tests are completely normal, you will receive your results only by: °MyChart Message (if you have MyChart) OR °A paper copy in the mail °If you have any lab test that is abnormal or we need to change your treatment, we will call you to review the results. ° °Testing/Procedures: °Your physician has requested that you have an echocardiogram. Echocardiography is a painless test that uses sound waves to create images of your heart. It provides your doctor with information about the size and shape of your heart and how well your heart’s chambers and valves are working. This procedure takes approximately one hour. There are no restrictions for this procedure. ° °Follow-Up: °At [facility], you and your health needs are our priority.  As part of our continuing mission to provide you with exceptional heart care, we have created designated Provider Care Teams.  These Care Teams include your primary Cardiologist (physician) and Advanced Practice Providers (APPs -  Physician Assistants and Nurse Practitioners) who all work together to provide you with the care you need, when you need it. ° °We recommend signing up for the patient portal called "MyChart".  Sign up information is provided on this After Visit Summary.  MyChart is used to connect with patients for Virtual Visits (Telemedicine).  Patients are able to view lab/test results, encounter notes, upcoming appointments, etc.  Non-urgent messages can be sent to your provider as well.   °To learn more about what you can do with MyChart, go to [email or web address].   ° °Your next appointment:   °As needed ° °The format for  your next appointment:   °In Person ° °Provider:   °Peter Nishan, MD { ° ° °

## 2022-01-16 ENCOUNTER — Ambulatory Visit (HOSPITAL_COMMUNITY): Payer: Medicaid Other | Attending: Internal Medicine

## 2022-01-16 DIAGNOSIS — R011 Cardiac murmur, unspecified: Secondary | ICD-10-CM | POA: Insufficient documentation

## 2022-01-16 LAB — ECHOCARDIOGRAM COMPLETE
Area-P 1/2: 3.95 cm2
S' Lateral: 2.7 cm

## 2022-03-29 ENCOUNTER — Telehealth: Payer: Self-pay | Admitting: Hematology and Oncology

## 2022-03-29 ENCOUNTER — Inpatient Hospital Stay (HOSPITAL_COMMUNITY)
Admission: AD | Admit: 2022-03-29 | Discharge: 2022-03-29 | Disposition: A | Discharge status: Home / Self Care | Payer: Medicaid Other | Attending: Obstetrics and Gynecology | Admitting: Obstetrics and Gynecology

## 2022-03-29 ENCOUNTER — Encounter (HOSPITAL_COMMUNITY): Payer: Self-pay

## 2022-03-29 ENCOUNTER — Other Ambulatory Visit: Payer: Self-pay

## 2022-03-29 DIAGNOSIS — D509 Iron deficiency anemia, unspecified: Secondary | ICD-10-CM | POA: Insufficient documentation

## 2022-03-29 DIAGNOSIS — O99012 Anemia complicating pregnancy, second trimester: Secondary | ICD-10-CM | POA: Diagnosis not present

## 2022-03-29 DIAGNOSIS — R42 Dizziness and giddiness: Secondary | ICD-10-CM | POA: Diagnosis present

## 2022-03-29 DIAGNOSIS — Z3A27 27 weeks gestation of pregnancy: Secondary | ICD-10-CM | POA: Insufficient documentation

## 2022-03-29 DIAGNOSIS — O26892 Other specified pregnancy related conditions, second trimester: Secondary | ICD-10-CM | POA: Diagnosis present

## 2022-03-29 LAB — CBC
HCT: 26.5 % — ABNORMAL LOW (ref 36.0–46.0)
Hemoglobin: 8.2 g/dL — ABNORMAL LOW (ref 12.0–15.0)
MCH: 25.6 pg — ABNORMAL LOW (ref 26.0–34.0)
MCHC: 30.9 g/dL (ref 30.0–36.0)
MCV: 82.8 fL (ref 80.0–100.0)
Platelets: 310 10*3/uL (ref 150–400)
RBC: 3.2 MIL/uL — ABNORMAL LOW (ref 3.87–5.11)
RDW: 16.3 % — ABNORMAL HIGH (ref 11.5–15.5)
WBC: 7 10*3/uL (ref 4.0–10.5)
nRBC: 0 % (ref 0.0–0.2)

## 2022-03-29 LAB — URINALYSIS, MICROSCOPIC (REFLEX): RBC / HPF: NONE SEEN RBC/hpf (ref 0–5)

## 2022-03-29 LAB — URINALYSIS, ROUTINE W REFLEX MICROSCOPIC
Bilirubin Urine: NEGATIVE
Glucose, UA: 250 mg/dL — AB
Hgb urine dipstick: NEGATIVE
Ketones, ur: NEGATIVE mg/dL
Nitrite: NEGATIVE
Protein, ur: NEGATIVE mg/dL
Specific Gravity, Urine: <1.005 — ABNORMAL LOW (ref 1.005–1.030)
pH: 7 (ref 5.0–8.0)

## 2022-03-29 MED ORDER — SODIUM CHLORIDE 0.9 % IV SOLN
510.0000 mg | Freq: Once | INTRAVENOUS | Status: AC
  Administered 2022-03-29: 510 mg via INTRAVENOUS
  Filled 2022-03-29: qty 17

## 2022-03-29 MED ORDER — SODIUM CHLORIDE 0.9 % IV SOLN: 300.0000 mg | Freq: Once | INTRAVENOUS | Status: DC

## 2022-03-29 MED ORDER — SODIUM CHLORIDE 0.9 % IV SOLN: INTRAVENOUS | Status: DC

## 2022-03-29 NOTE — MAU Provider Note (Signed)
Patient Tracey Leonard is a 36 y.o. here with complaints of dizziness at work that started at 7 am. She reports that she was standing and felt weak. She usually eats oatmeal for breakfast, and she had oatmeal this morning.  She is anemic and has been scheduled for iron infusions at the end of the month; she also is planning to see a hematologist in August.   She denies contractions. She decreased fetal movements. She denies LOF, VB, dysuria, vaginal discharge or other ob-gyn complaints.  She reports an otherwise uncomplicated pregnancy.  History     CSN: [id number]  Arrival date and time: 03/29/22 0813   Event Date/Time   First Provider Initiated Contact with Patient 03/29/22 [phone]      Chief Complaint  Patient presents with   Dizziness   Dizziness This is a new problem. The current episode started today. The problem has been unchanged. Pertinent negatives include no abdominal pain, chills, coughing, fever, vertigo, vomiting or weakness. Nothing aggravates the symptoms. She has tried nothing for the symptoms.    OB History     Gravida  2   Para  1   Term  1   Preterm      AB      Living  1      SAB      IAB      Ectopic      Multiple      Live Births  1           Past Medical History:  Diagnosis Date   Cardiac murmur    seen by Dr. Charlton Leonard 05/2015; echo ordered, no record of echo having been done. Next appt 11/22/16   Dorsal wrist ganglion 01/2016   right   Low blood potassium     Past Surgical History:  Procedure Laterality Date   NO PAST SURGERIES      Family History  Problem Relation Age of Onset   Healthy Mother    Other Father        MVA    Social History   Tobacco Use   Smoking status: Never   Smokeless tobacco: Never  Vaping Use   Vaping Use: Never used  Substance Use Topics   Alcohol use: No    Alcohol/week: 0.0 standard drinks of alcohol   Drug use: No    Allergies:  Allergies  Allergen Reactions   Amoxicillin Rash     Medications Prior to Admission  Medication Sig Dispense Refill Last Dose   aspirin 81 MG chewable tablet Chew by mouth daily.   03/29/2022 at 0600   ferrous sulfate 324 MG TBEC Take 324 mg by mouth.   03/29/2022 at 0600   calcium carbonate (TUMS - DOSED IN MG ELEMENTAL CALCIUM) 500 MG chewable tablet Chew 1 tablet by mouth daily.   03/27/2022   cetirizine (ZYRTEC ALLERGY) 10 MG tablet Take 1 tablet (10 mg total) by mouth daily. 30 tablet 5    Clobetasol Propionate 0.025 % CREA Apply 1 application topically 2 (two) times daily as needed. Do not use longer than 14 days. (Patient not taking: Reported on 10/07/2018) 60 g 0    Crisaborole (EUCRISA) 2 % OINT Apply 1 application topically 2 (two) times daily as needed. (Patient not taking: Reported on 11/14/2019) 60 g 3    fluticasone (FLONASE) 50 MCG/ACT nasal spray Place 1 spray into both nostrils daily. (Patient not taking: Reported on 10/07/2018) 11.1 g 2    guaifenesin (ROBITUSSIN)  100 MG/5ML syrup Take 5-10 mLs (100-200 mg total) by mouth every 4 (four) hours as needed for cough. (Patient not taking: Reported on 10/07/2018) 60 mL 0    magic mouthwash w/lidocaine SOLN Take 10 mLs by mouth 3 (three) times daily. (Patient not taking: Reported on 10/07/2018) 80 mL 0    metroNIDAZOLE (METROGEL) 0.75 % vaginal gel Place 1 Applicatorful vaginally at bedtime. Apply one applicatorful to vagina at bedtime for 5 days (Patient not taking: Reported on 12/23/2021) 70 g 1    montelukast (SINGULAIR) 10 MG tablet Take 1 tablet (10 mg total) by mouth at bedtime. 30 tablet 5    Multiple Vitamins-Minerals (MULTIPLE VITAMINS/WOMENS PO) Take by mouth. (Patient not taking: Reported on 12/23/2021)      Prenatal Multivit-Min-Fe-FA (PRENATAL VITAMINS) 0.8 MG tablet Take 1 tablet by mouth daily. 30 tablet 12    Prenatal Vit-Fe Phos-FA-Omega (VITAFOL GUMMIES) 3.33-0.333-34.8 MG CHEW Chew 1 tablet by mouth daily before breakfast. 90 tablet 11 03/27/2022   TRI-LO-SPRINTEC 0.18/0.215/0.25  MG-25 MCG tab TK 1 T PO QD  5     Review of Systems  Constitutional:  Negative for chills and fever.  Respiratory:  Negative for cough.   Gastrointestinal:  Negative for abdominal pain and vomiting.  Neurological:  Positive for dizziness. Negative for vertigo and weakness.   Physical Exam   Blood pressure 135/79, pulse (!) 108, temperature 98.3 F (36.8 C), temperature source Oral, resp. rate 16, height 5\' 3"  (1.6 m), weight 90 kg, SpO2 96 %.  Physical Exam Constitutional:      Appearance: Normal appearance.  Pulmonary:     Effort: Pulmonary effort is normal.  Abdominal:     General: Abdomen is flat.  Musculoskeletal:        General: Normal range of motion.  Skin:    General: Skin is warm.     Capillary Refill: Capillary refill takes less than 2 seconds.  Neurological:     General: No focal deficit present.     Mental Status: She is alert.  Psychiatric:        Mood and Affect: Mood normal.     MAU Course  Procedures  MDM -NST: 150 bpm, mod var, present acel, no decels, no contractions -will draw CBC -UA is normal -will give snack and observe -EKG ordered > NSR  -Hgb is 8.2>discussed with Dr. ; will do iron infusion here in MAU.  Reassessment (12:31 PM) -patient reports that she is feeling well; tolerated iron infusion well.   Assessment and Plan   1. Iron deficiency anemia, unspecified iron deficiency anemia type   2. [redacted] weeks gestation of pregnancy    -patient feels better, she tolerated iron infusion and has been ambulating in MAU, eating snack.  -continue taking iron pills -stable for discharge with plans to keep follow-up appt in 2 weeks and to reschedule her iron infusions (patient is planning to call to reschedule) -all questions answered; she is safe for discharge.    Jolayne Panther Brantlee Penn 03/29/2022, 9:22 AM

## 2022-03-29 NOTE — MAU Note (Signed)
Tracey Leonard is a 36 y.o. at [redacted]w[redacted]d here in MAU reporting: around 7 while she was at work she felt lightheaded. States she was told she she needs an iron infusion. Not currently felt lightheaded. No pain currently. No bleeding or LOF. +FM.   Onset of complaint: today  Pain score: 0/10  Vitals:   03/29/22 0836  BP: (!) 149/77  Pulse: (!) 108  Resp: 16  Temp: 98.3 F (36.8 C)  SpO2: 97%     XID:[id number] to obtain FHT in triage, pt to room 129 and Vladimir Crofts CNM at bedside, FHT obtained with bedside u/s and EFM applied   Lab orders placed from triage: UA

## 2022-03-29 NOTE — Telephone Encounter (Signed)
Scheduled appt per 7/11 referral. Pt is aware of appt date and time. Pt is aware to arrive 15 mins prior to appt time and to bring and updated insurance card. Pt is aware of appt location.   

## 2022-03-30 ENCOUNTER — Other Ambulatory Visit (HOSPITAL_COMMUNITY): Payer: Self-pay

## 2022-04-03 ENCOUNTER — Encounter (HOSPITAL_COMMUNITY)
Admission: RE | Admit: 2022-04-03 | Discharge: 2022-04-03 | Disposition: A | Discharge status: Home / Self Care | Payer: Medicaid Other | Source: Ambulatory Visit | Attending: Obstetrics and Gynecology | Admitting: Obstetrics and Gynecology

## 2022-04-03 DIAGNOSIS — D649 Anemia, unspecified: Secondary | ICD-10-CM | POA: Insufficient documentation

## 2022-04-03 MED ORDER — SODIUM CHLORIDE 0.9 % IV SOLN
510.0000 mg | INTRAVENOUS | Status: DC
  Administered 2022-04-03: 510 mg via INTRAVENOUS
  Filled 2022-04-03: qty 510

## 2022-04-10 ENCOUNTER — Encounter (HOSPITAL_COMMUNITY)
Admission: RE | Admit: 2022-04-10 | Discharge: 2022-04-10 | Disposition: A | Discharge status: Home / Self Care | Payer: Medicaid Other | Source: Ambulatory Visit | Attending: Obstetrics and Gynecology | Admitting: Obstetrics and Gynecology

## 2022-04-10 DIAGNOSIS — D649 Anemia, unspecified: Secondary | ICD-10-CM | POA: Diagnosis not present

## 2022-04-10 MED ORDER — SODIUM CHLORIDE 0.9 % IV SOLN
510.0000 mg | INTRAVENOUS | Status: DC
  Administered 2022-04-10: 510 mg via INTRAVENOUS
  Filled 2022-04-10: qty 510

## 2022-04-12 ENCOUNTER — Other Ambulatory Visit: Payer: Medicaid Other

## 2022-04-12 ENCOUNTER — Encounter: Payer: Medicaid Other | Admitting: Hematology and Oncology

## 2022-04-17 ENCOUNTER — Encounter (HOSPITAL_COMMUNITY): Payer: Medicaid Other

## 2022-04-24 ENCOUNTER — Encounter (HOSPITAL_COMMUNITY): Payer: Medicaid Other

## 2022-05-01 ENCOUNTER — Inpatient Hospital Stay: Payer: Medicaid Other

## 2022-05-01 ENCOUNTER — Inpatient Hospital Stay: Payer: Medicaid Other | Attending: Hematology and Oncology | Admitting: Hematology and Oncology

## 2022-05-01 NOTE — Progress Notes (Deleted)
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  No orders of the defined types were placed in this encounter.  Physiological anemia of pregnancy and iron deficiency are very common cause of anemia during pregnancy.    We have discussed that both oral and IV iron are both effective for replenishing iron stores.  We generally use oral iron for most gravid as with iron deficiency who can tolerate it and for all individuals being treated during the first trimester.  We will give IV iron to gravid as being on the first trimester who cannot tolerate oral iron although also have severe anemia especially later in the pregnancy.  There are 3 meta-analysis published in 20 18-20 19 evaluating the benefits and risks of oral versus IV iron based on data from randomized trials in pregnant or postpartum females with iron deficiency.  These analysis suggest that iron supplementation by either route increase her hemoglobin and ferritin levels, compared with oral iron IV iron was associated with higher hemoglobin level following therapy.  Although analysis from that adverse effects were less frequent with IV iron.  Infant weights, rates of premature birth and pregnancy loss but not different between groups.    Usually the choice of IV iron is based on the cost and burden of administration.  We have also discussed about the risks of anaphylaxis with IV iron although this is very uncommon.  We have discussed that in case of an anaphylactic reaction we may have to administer medication that may be potentially harmful for the fetus. Hypophosphatemia can be seen with certain iron formularies.  Patient agreed to IV iron after discussion as mentioned  above.   HISTORY OF PRESENTING ILLNESS:  Tracey Leonard 36 y.o. female is here because of anemia during pregnancy.  REVIEW OF SYSTEMS:   Constitutional: Denies fevers, chills or abnormal night sweats Eyes: Denies blurriness of vision, double vision or watery eyes Ears, nose, mouth, throat, and face: Denies mucositis or sore throat Respiratory: Denies cough, dyspnea or wheezes Cardiovascular: Denies palpitation, chest discomfort or lower extremity swelling Gastrointestinal:  Denies nausea, heartburn or change in bowel habits Skin: Denies abnormal skin rashes Lymphatics: Denies new lymphadenopathy or easy bruising Neurological:Denies numbness, tingling or new weaknesses Behavioral/Psych: Mood is stable, no new changes  All other systems were reviewed with the patient and are negative.  MEDICAL HISTORY:  Past Medical History:  Diagnosis Date   Cardiac murmur    seen by Dr. Charlton Haws 05/2015; echo ordered, no record of echo having been done. Next appt 11/22/16   Dorsal wrist ganglion 01/2016   right   Low blood potassium     SURGICAL HISTORY: Past Surgical History:  Procedure Laterality Date   NO PAST SURGERIES      SOCIAL HISTORY: Social History   Socioeconomic History   Marital status: Single    Spouse name: Not on file   Number of children: Not on file   Years of education: 15   Highest education level: Not on file  Occupational History   Occupation: ASSEMBLY AT [employer]    Employer: [employer]  Tobacco Use   Smoking status: Never   Smokeless tobacco: Never  Vaping Use   Vaping Use: Never used  Substance  and Sexual Activity   Alcohol use: No    Alcohol/week: 0.0 standard drinks of alcohol   Drug use: No   Sexual activity: Not Currently    Partners: Male    Birth control/protection: None  Other Topics Concern   Not on file  Social History Narrative   Not on file   Social Determinants of Health   Financial Resource Strain: Not on file  Food  Insecurity: Not on file  Transportation Needs: Not on file  Physical Activity: Not on file  Stress: Not on file  Social Connections: Not on file  Intimate Partner Violence: Not on file    FAMILY HISTORY: Family History  Problem Relation Age of Onset   Healthy Mother    Other Father        MVA    ALLERGIES:  is allergic to amoxicillin.  MEDICATIONS:  Current Outpatient Medications  Medication Sig Dispense Refill   aspirin 81 MG chewable tablet Chew by mouth daily.     calcium carbonate (TUMS - DOSED IN MG ELEMENTAL CALCIUM) 500 MG chewable tablet Chew 1 tablet by mouth daily.     cetirizine (ZYRTEC ALLERGY) 10 MG tablet Take 1 tablet (10 mg total) by mouth daily. 30 tablet 5   Clobetasol Propionate 0.025 % CREA Apply 1 application topically 2 (two) times daily as needed. Do not use longer than 14 days. (Patient not taking: Reported on 10/07/2018) 60 g 0   Crisaborole (EUCRISA) 2 % OINT Apply 1 application topically 2 (two) times daily as needed. (Patient not taking: Reported on 11/14/2019) 60 g 3   ferrous sulfate 324 MG TBEC Take 324 mg by mouth.     fluticasone (FLONASE) 50 MCG/ACT nasal spray Place 1 spray into both nostrils daily. (Patient not taking: Reported on 10/07/2018) 11.1 g 2   guaifenesin (ROBITUSSIN) 100 MG/5ML syrup Take 5-10 mLs (100-200 mg total) by mouth every 4 (four) hours as needed for cough. (Patient not taking: Reported on 10/07/2018) 60 mL 0   magic mouthwash w/lidocaine SOLN Take 10 mLs by mouth 3 (three) times daily. (Patient not taking: Reported on 10/07/2018) 80 mL 0   metroNIDAZOLE (METROGEL) 0.75 % vaginal gel Place 1 Applicatorful vaginally at bedtime. Apply one applicatorful to vagina at bedtime for 5 days (Patient not taking: Reported on 12/23/2021) 70 g 1   montelukast (SINGULAIR) 10 MG tablet Take 1 tablet (10 mg total) by mouth at bedtime. 30 tablet 5   Multiple Vitamins-Minerals (MULTIPLE VITAMINS/WOMENS PO) Take by mouth. (Patient not taking: Reported on  12/23/2021)     Prenatal Multivit-Min-Fe-FA (PRENATAL VITAMINS) 0.8 MG tablet Take 1 tablet by mouth daily. 30 tablet 12   Prenatal Vit-Fe Phos-FA-Omega (VITAFOL GUMMIES) 3.33-0.333-34.8 MG CHEW Chew 1 tablet by mouth daily before breakfast. 90 tablet 11   TRI-LO-SPRINTEC 0.18/0.215/0.25 MG-25 MCG tab TK 1 T PO QD  5   No current facility-administered medications for this visit.     PHYSICAL EXAMINATION: ECOG PERFORMANCE STATUS: {CHL ONC ECOG [phone]}  There were no vitals filed for this visit. There were no vitals filed for this visit.  GENERAL:alert, no distress and comfortable SKIN: skin color, texture, turgor are normal, no rashes or significant lesions EYES: normal, conjunctiva are pink and non-injected, sclera clear OROPHARYNX:no exudate, no erythema and lips, buccal mucosa, and tongue normal  NECK: supple, thyroid normal size, non-tender, without nodularity LYMPH:  no palpable lymphadenopathy in the cervical, axillary or inguinal LUNGS: clear to auscultation and percussion with normal breathing effort HEART:  regular rate & rhythm and no murmurs and no lower extremity edema ABDOMEN:abdomen soft, non-tender and normal bowel sounds Musculoskeletal:no cyanosis of digits and no clubbing  PSYCH: alert & oriented x 3 with fluent speech NEURO: no focal motor/sensory deficits  LABORATORY DATA:  I have reviewed the data as listed Lab Results  Component Value Date   WBC 7.0 03/29/2022   HGB 8.2 (L) 03/29/2022   HCT 26.5 (L) 03/29/2022   MCV 82.8 03/29/2022   PLT 310 03/29/2022     Chemistry      Component Value Date/Time   NA 135 10/18/2016 1025   K 3.1 (L) 10/18/2016 1025   CL 104 10/18/2016 1025   CO2 25 10/18/2016 1025   BUN <5 (L) 10/18/2016 1025   CREATININE 0.91 10/18/2016 1025   CREATININE 0.81 03/05/2015 1315      Component Value Date/Time   CALCIUM 8.9 10/18/2016 1025   ALKPHOS 64 10/18/2016 1025   AST 25 10/18/2016 1025   ALT 16 10/18/2016 1025    BILITOT 0.7 10/18/2016 1025       RADIOGRAPHIC STUDIES: I have personally reviewed the radiological images as listed and agreed with the findings in the report. No results found.  All questions were answered. The patient knows to call the clinic with any problems, questions or concerns. I spent *** minutes in the care of this patient including H and P, review of records, counseling and coordination of care.     Rachel Moulds, MD 05/01/2022 8:33 AM

## 2022-05-10 ENCOUNTER — Ambulatory Visit: Payer: Medicaid Other

## 2022-05-17 ENCOUNTER — Encounter: Payer: Medicaid Other | Admitting: Registered"

## 2022-05-29 LAB — OB RESULTS CONSOLE GBS: GBS: POSITIVE

## 2022-06-12 ENCOUNTER — Inpatient Hospital Stay (HOSPITAL_COMMUNITY): Payer: Medicaid Other | Admitting: Anesthesiology

## 2022-06-12 ENCOUNTER — Telehealth (HOSPITAL_COMMUNITY): Payer: Self-pay | Admitting: *Deleted

## 2022-06-12 ENCOUNTER — Inpatient Hospital Stay (HOSPITAL_COMMUNITY)
Admission: AD | Admit: 2022-06-12 | Discharge: 2022-06-14 | DRG: 807 | Disposition: A | Discharge status: Home / Self Care | Payer: Medicaid Other | Attending: Obstetrics and Gynecology | Admitting: Obstetrics and Gynecology

## 2022-06-12 ENCOUNTER — Encounter (HOSPITAL_COMMUNITY): Payer: Self-pay

## 2022-06-12 ENCOUNTER — Encounter (HOSPITAL_COMMUNITY): Payer: Self-pay | Admitting: Obstetrics and Gynecology

## 2022-06-12 ENCOUNTER — Other Ambulatory Visit: Payer: Self-pay

## 2022-06-12 DIAGNOSIS — O9902 Anemia complicating childbirth: Secondary | ICD-10-CM | POA: Diagnosis present

## 2022-06-12 DIAGNOSIS — Z3A38 38 weeks gestation of pregnancy: Secondary | ICD-10-CM | POA: Diagnosis not present

## 2022-06-12 DIAGNOSIS — O24420 Gestational diabetes mellitus in childbirth, diet controlled: Secondary | ICD-10-CM | POA: Diagnosis present

## 2022-06-12 DIAGNOSIS — O99824 Streptococcus B carrier state complicating childbirth: Secondary | ICD-10-CM | POA: Diagnosis present

## 2022-06-12 DIAGNOSIS — O134 Gestational [pregnancy-induced] hypertension without significant proteinuria, complicating childbirth: Secondary | ICD-10-CM | POA: Diagnosis present

## 2022-06-12 DIAGNOSIS — O1404 Mild to moderate pre-eclampsia, complicating childbirth: Secondary | ICD-10-CM | POA: Diagnosis present

## 2022-06-12 DIAGNOSIS — O1490 Unspecified pre-eclampsia, unspecified trimester: Principal | ICD-10-CM | POA: Diagnosis present

## 2022-06-12 LAB — CBC
HCT: 35.2 % — ABNORMAL LOW (ref 36.0–46.0)
HCT: 35.9 % — ABNORMAL LOW (ref 36.0–46.0)
Hemoglobin: 11.1 g/dL — ABNORMAL LOW (ref 12.0–15.0)
Hemoglobin: 11.3 g/dL — ABNORMAL LOW (ref 12.0–15.0)
MCH: 27.9 pg (ref 26.0–34.0)
MCH: 28 pg (ref 26.0–34.0)
MCHC: 31.5 g/dL (ref 30.0–36.0)
MCHC: 31.5 g/dL (ref 30.0–36.0)
MCV: 88.4 fL (ref 80.0–100.0)
MCV: 89.1 fL (ref 80.0–100.0)
Platelets: 288 10*3/uL (ref 150–400)
Platelets: 256 10*3/uL (ref 150–400)
RBC: 3.98 MIL/uL (ref 3.87–5.11)
RBC: 4.03 MIL/uL (ref 3.87–5.11)
RDW: 16.6 % — ABNORMAL HIGH (ref 11.5–15.5)
RDW: 16.5 % — ABNORMAL HIGH (ref 11.5–15.5)
WBC: 5.1 10*3/uL (ref 4.0–10.5)
WBC: 9.3 10*3/uL (ref 4.0–10.5)
nRBC: 0 % (ref 0.0–0.2)
nRBC: 0 % (ref 0.0–0.2)

## 2022-06-12 LAB — COMPREHENSIVE METABOLIC PANEL
ALT: 18 U/L (ref 0–44)
AST: 24 U/L (ref 15–41)
Albumin: 3.1 g/dL — ABNORMAL LOW (ref 3.5–5.0)
Alkaline Phosphatase: 102 U/L (ref 38–126)
Anion gap: 8 (ref 5–15)
BUN: <5 mg/dL — ABNORMAL LOW (ref 6–20)
CO2: 22 mmol/L (ref 22–32)
Calcium: 9.2 mg/dL (ref 8.9–10.3)
Chloride: 106 mmol/L (ref 98–111)
Creatinine, Ser: 0.46 mg/dL (ref 0.44–1.00)
GFR, Estimated: >60 mL/min (ref >60)
Glucose, Bld: 98 mg/dL (ref 70–99)
Potassium: 3.8 mmol/L (ref 3.5–5.1)
Sodium: 136 mmol/L (ref 135–145)
Total Bilirubin: 0.5 mg/dL (ref 0.3–1.2)
Total Protein: 6 g/dL — ABNORMAL LOW (ref 6.5–8.1)

## 2022-06-12 LAB — TYPE AND SCREEN
ABO/RH(D): A POS
Antibody Screen: NEGATIVE

## 2022-06-12 LAB — PROTEIN / CREATININE RATIO, URINE
Creatinine, Urine: 13 mg/dL
Total Protein, Urine: <6 mg/dL

## 2022-06-12 MED ORDER — SOD CITRATE-CITRIC ACID 500-334 MG/5ML PO SOLN: 30.0000 mL | ORAL | Status: DC | PRN

## 2022-06-12 MED ORDER — EPHEDRINE 5 MG/ML INJ: 10.0000 mg | INTRAVENOUS | Status: DC | PRN

## 2022-06-12 MED ORDER — OXYTOCIN-SODIUM CHLORIDE 30-0.9 UT/500ML-% IV SOLN
2.5000 [IU]/h | INTRAVENOUS | Status: DC
  Administered 2022-06-12: 2.5 [IU]/h via INTRAVENOUS
  Filled 2022-06-12: qty 500

## 2022-06-12 MED ORDER — OXYCODONE-ACETAMINOPHEN 5-325 MG PO TABS: 1.0000 | ORAL_TABLET | ORAL | Status: DC | PRN

## 2022-06-12 MED ORDER — FENTANYL-BUPIVACAINE-NACL 0.5-0.125-0.9 MG/250ML-% EP SOLN
12.0000 mL/h | EPIDURAL | Status: DC | PRN
  Administered 2022-06-12: 12 mL/h via EPIDURAL
  Filled 2022-06-12: qty 250

## 2022-06-12 MED ORDER — TERBUTALINE SULFATE 1 MG/ML IJ SOLN: 0.2500 mg | Freq: Once | INTRAMUSCULAR | Status: DC | PRN

## 2022-06-12 MED ORDER — LACTATED RINGERS IV SOLN
500.0000 mL | Freq: Once | INTRAVENOUS | Status: AC
  Administered 2022-06-12: 500 mL via INTRAVENOUS

## 2022-06-12 MED ORDER — SENNOSIDES-DOCUSATE SODIUM 8.6-50 MG PO TABS
2.0000 | ORAL_TABLET | Freq: Every day | ORAL | Status: DC
  Administered 2022-06-13 – 2022-06-14 (×2): 2 via ORAL
  Filled 2022-06-12 (×2): qty 2

## 2022-06-12 MED ORDER — LACTATED RINGERS IV SOLN: INTRAVENOUS | Status: DC

## 2022-06-12 MED ORDER — OXYTOCIN-SODIUM CHLORIDE 30-0.9 UT/500ML-% IV SOLN
1.0000 m[IU]/min | INTRAVENOUS | Status: DC
  Administered 2022-06-12: 2 m[IU]/min via INTRAVENOUS

## 2022-06-12 MED ORDER — LACTATED RINGERS IV SOLN: 500.0000 mL | INTRAVENOUS | Status: DC | PRN

## 2022-06-12 MED ORDER — LIDOCAINE-EPINEPHRINE (PF) 1.5 %-1:200000 IJ SOLN
INTRAMUSCULAR | Status: DC | PRN
  Administered 2022-06-12: 5 mL via EPIDURAL

## 2022-06-12 MED ORDER — WITCH HAZEL-GLYCERIN EX PADS: 1.0000 | MEDICATED_PAD | CUTANEOUS | Status: DC | PRN

## 2022-06-12 MED ORDER — COCONUT OIL OIL: 1.0000 | TOPICAL_OIL | Status: DC | PRN

## 2022-06-12 MED ORDER — IBUPROFEN 600 MG PO TABS
600.0000 mg | ORAL_TABLET | Freq: Four times a day (QID) | ORAL | Status: DC
  Administered 2022-06-12 – 2022-06-14 (×7): 600 mg via ORAL
  Filled 2022-06-12 (×7): qty 1

## 2022-06-12 MED ORDER — LIDOCAINE HCL (PF) 1 % IJ SOLN: 30.0000 mL | INTRAMUSCULAR | Status: DC | PRN

## 2022-06-12 MED ORDER — TETANUS-DIPHTH-ACELL PERTUSSIS 5-2.5-18.5 LF-MCG/0.5 IM SUSY: 0.5000 mL | PREFILLED_SYRINGE | Freq: Once | INTRAMUSCULAR | Status: DC

## 2022-06-12 MED ORDER — NIFEDIPINE ER OSMOTIC RELEASE 30 MG PO TB24
30.0000 mg | ORAL_TABLET | Freq: Every day | ORAL | Status: DC
  Administered 2022-06-12 – 2022-06-13 (×2): 30 mg via ORAL
  Filled 2022-06-12 (×2): qty 1

## 2022-06-12 MED ORDER — OXYCODONE-ACETAMINOPHEN 5-325 MG PO TABS: 2.0000 | ORAL_TABLET | ORAL | Status: DC | PRN

## 2022-06-12 MED ORDER — ONDANSETRON HCL 4 MG/2ML IJ SOLN
4.0000 mg | Freq: Four times a day (QID) | INTRAMUSCULAR | Status: DC | PRN
Start: 2022-06-12 — End: 2022-06-12

## 2022-06-12 MED ORDER — PHENYLEPHRINE 80 MCG/ML (10ML) SYRINGE FOR IV PUSH (FOR BLOOD PRESSURE SUPPORT): 80.0000 ug | PREFILLED_SYRINGE | INTRAVENOUS | Status: DC | PRN

## 2022-06-12 MED ORDER — DIPHENHYDRAMINE HCL 25 MG PO CAPS: 25.0000 mg | ORAL_CAPSULE | Freq: Four times a day (QID) | ORAL | Status: DC | PRN

## 2022-06-12 MED ORDER — VANCOMYCIN HCL IN DEXTROSE 1-5 GM/200ML-% IV SOLN
1000.0000 mg | Freq: Two times a day (BID) | INTRAVENOUS | Status: DC
  Administered 2022-06-12: 1000 mg via INTRAVENOUS
  Filled 2022-06-12: qty 200

## 2022-06-12 MED ORDER — DIBUCAINE (PERIANAL) 1 % EX OINT: 1.0000 | TOPICAL_OINTMENT | CUTANEOUS | Status: DC | PRN

## 2022-06-12 MED ORDER — DIPHENHYDRAMINE HCL 50 MG/ML IJ SOLN: 12.5000 mg | INTRAMUSCULAR | Status: DC | PRN

## 2022-06-12 MED ORDER — ACETAMINOPHEN 325 MG PO TABS
650.0000 mg | ORAL_TABLET | ORAL | Status: DC | PRN
  Administered 2022-06-13 (×2): 650 mg via ORAL
  Filled 2022-06-12 (×2): qty 2

## 2022-06-12 MED ORDER — ONDANSETRON HCL 4 MG/2ML IJ SOLN: 4.0000 mg | INTRAMUSCULAR | Status: DC | PRN

## 2022-06-12 MED ORDER — ONDANSETRON HCL 4 MG PO TABS: 4.0000 mg | ORAL_TABLET | ORAL | Status: DC | PRN

## 2022-06-12 MED ORDER — OXYTOCIN BOLUS FROM INFUSION
333.0000 mL | Freq: Once | INTRAVENOUS | Status: AC
  Administered 2022-06-12: 333 mL via INTRAVENOUS

## 2022-06-12 MED ORDER — BENZOCAINE-MENTHOL 20-0.5 % EX AERO: 1.0000 | INHALATION_SPRAY | CUTANEOUS | Status: DC | PRN

## 2022-06-12 MED ORDER — ZOLPIDEM TARTRATE 5 MG PO TABS: 5.0000 mg | ORAL_TABLET | Freq: Every evening | ORAL | Status: DC | PRN

## 2022-06-12 MED ORDER — SIMETHICONE 80 MG PO CHEW: 80.0000 mg | CHEWABLE_TABLET | ORAL | Status: DC | PRN

## 2022-06-12 MED ORDER — ACETAMINOPHEN 325 MG PO TABS: 650.0000 mg | ORAL_TABLET | ORAL | Status: DC | PRN

## 2022-06-12 MED ORDER — PRENATAL MULTIVITAMIN CH
1.0000 | ORAL_TABLET | Freq: Every day | ORAL | Status: DC
  Administered 2022-06-13 – 2022-06-14 (×2): 1 via ORAL
  Filled 2022-06-12 (×2): qty 1

## 2022-06-12 NOTE — Progress Notes (Signed)
FB displaced. CE now 3/50/-3. AROM clear at 1320. Begin pitocin 2x2. PreE labs WNL BP (!) 147/83   Pulse 77   Temp 97.8 F (36.6 C) (Oral)   Resp 17   Ht 5\' 4"  (1.626 m)   Wt 92.8 kg   BMI 35.10 kg/m

## 2022-06-12 NOTE — Anesthesia Preprocedure Evaluation (Addendum)
Anesthesia Evaluation  Patient identified by MRN, date of birth, ID band Patient awake    Reviewed: Allergy & Precautions, NPO status , Patient's Chart, lab work & pertinent test results  Airway Mallampati: II  TM Distance: >3 FB Neck ROM: Full    Dental no notable dental hx.    Pulmonary neg pulmonary ROS,    Pulmonary exam normal        Cardiovascular hypertension,  Rhythm:Regular Rate:Normal     Neuro/Psych negative neurological ROS  negative psych ROS   GI/Hepatic negative GI ROS, Neg liver ROS,   Endo/Other  negative endocrine ROS  Renal/GU negative Renal ROS  negative genitourinary   Musculoskeletal   Abdominal Normal abdominal exam  (+)   Peds  Hematology negative hematology ROS (+)   Anesthesia Other Findings   Reproductive/Obstetrics (+) Pregnancy                             Anesthesia Physical Anesthesia Plan  ASA: 2  Anesthesia Plan: Epidural   Post-op Pain Management:    Induction:   PONV Risk Score and Plan: 2 and Treatment may vary due to age or medical condition  Airway Management Planned: Natural Airway  Additional Equipment: None  Intra-op Plan:   Post-operative Plan:   Informed Consent: I have reviewed the patients History and Physical, chart, labs and discussed the procedure including the risks, benefits and alternatives for the proposed anesthesia with the patient or authorized representative who has indicated his/her understanding and acceptance.     Dental advisory given  Plan Discussed with:   Anesthesia Plan Comments:         Anesthesia Quick Evaluation

## 2022-06-12 NOTE — Telephone Encounter (Signed)
Preadmission screen  

## 2022-06-12 NOTE — Progress Notes (Signed)
Labor Note  S: Feeling more painful contractions, desires epidural, persistent leakage of fluid c/w AROM  O: BP (!) 149/80   Pulse 82   Temp 97.8 F (36.6 C) (Oral)   Resp 16   Ht 5\' 4"  (1.626 m)   Wt 92.8 kg   BMI 35.10 kg/m  CE: 4/70/-2, more midposition FHR: Baseline 135, +accels, -decels, mod variability TOCO q2-3, pitocin at 49mU/min  A/P: This is a 36 y.o. G2P1001 at [redacted]w[redacted]d  admitted for IOL for PreE w/o SF FWB: Cat 1  MWB: Asx from PreE standpoint, PreE labs Wnl at baseline Labor course: s/p FB for ripening and AROM. Progressing through latent labor, continue to titrate pitocin per protocol GDMA1: 98  Anticipate SVD

## 2022-06-12 NOTE — H&P (Addendum)
Tracey Leonard is a 36 y.o. female presenting for induction of labor. Elevated BP last week in clinic, PreE labs drawn back with uPC 0.39. Asymptomatic at this time. +FM, denies VB, LOF, CTX.  PNC c/b 1) H/o GHTN in g1 - no induction, developed at term, no postpartum meds 2) GDMA1 - diet controlled (1hr 159, 3hr 3 of 4 elevated) 3) Low lying placenta - resolved 4) BMI 35 - baby ASA 5) Anemia - s/p IV venofer, Hgb 10.5 on 8/23 6) GBS pos - amox allergy, clinda resistant per culture    OB History     Gravida  2   Para  1   Term  1   Preterm      AB      Living  1      SAB      IAB      Ectopic      Multiple      Live Births  1          Past Medical History:  Diagnosis Date   Cardiac murmur    seen by Dr. Jenkins Rouge 05/2015; echo ordered, no record of echo having been done. Next appt 11/22/16   Dorsal wrist ganglion 01/2016   right   Low blood potassium    Past Surgical History:  Procedure Laterality Date   NO PAST SURGERIES     Family History: family history includes Healthy in her mother; Other in her father. Social History:  reports that she has never smoked. She has never used smokeless tobacco. She reports that she does not drink alcohol and does not use drugs.     Maternal Diabetes: Yes:  Diabetes Type:  Diet controlled Genetic Screening: Normal Maternal Ultrasounds/Referrals: Normal Fetal Ultrasounds or other Referrals:  None Maternal Substance Abuse:  No Significant Maternal Medications:  None Significant Maternal Lab Results:  Group B Strep positive Number of Prenatal Visits:greater than 3 verified prenatal visits Other Comments:  None  Review of Systems  Constitutional:  Negative for chills and fever.  Respiratory:  Negative for shortness of breath.   Cardiovascular:  Negative for chest pain, palpitations and leg swelling.  Gastrointestinal:  Negative for abdominal pain, nausea and vomiting.  Neurological:  Negative for dizziness,  weakness and headaches.  Psychiatric/Behavioral:  Negative for suicidal ideas.    Maternal Medical History:  Reason for admission: Nausea.    Dilation: 1 Exam by:: Tracey Leonard Blood pressure (!) 147/83, pulse 77, temperature 97.8 F (36.6 C), temperature source Oral, resp. rate 17, height 5\' 4"  (1.626 m), weight 92.8 kg. Exam Physical Exam Constitutional:      General: She is not in acute distress.    Appearance: She is well-developed.  HENT:     Head: Normocephalic and atraumatic.  Eyes:     Pupils: Pupils are equal, round, and reactive to light.  Cardiovascular:     Rate and Rhythm: Normal rate and regular rhythm.     Heart sounds: No murmur heard.    No gallop.  Abdominal:     Tenderness: There is no abdominal tenderness. There is no guarding or rebound.  Genitourinary:    Vagina: Normal.  Musculoskeletal:        General: Normal range of motion.     Cervical back: Normal range of motion and neck supple.  Skin:    General: Skin is warm and dry.  Neurological:     Mental Status: She is alert and oriented to person, place, and time.  Prenatal labs: ABO, Rh: A/Positive/-- (03/13 0000) Antibody: Negative (03/13 0000) Rubella: Immune (03/13 0000) RPR: Nonreactive (03/13 0000)  HBsAg: Negative (03/13 0000)  HIV: Non-reactive (03/13 0000)  GBS: Positive/-- (09/11 0000)   Assessment/Plan: Cat 1 tracing, TOCO quiet. This is a 36yo G2P1001 admitted for induction for PreE without severe features. FB placed at  1135, BSUS confirms vertex. Vanc for GBS ppx, CBG q4h/q2h latent/active labor. Begin pitocin once FB out, AROM once amenable. Desires epidural, baby boy, pelvis proven to 6lb2oz  SIUP confirmed vtx. Digitally, the 20fr Foley was introduced past the internal os and filled with 50cc NS. Tension applied and catheter taped to patient's inner left thigh. Patient tolerated procedure well.   Tracey Leonard Tracey Leonard 06/12/2022, 12:09 PM

## 2022-06-12 NOTE — Anesthesia Procedure Notes (Signed)
Epidural Patient location during procedure: OB  Staffing Anesthesiologist: Darral Dash, DO Performed: anesthesiologist   Preanesthetic Checklist Completed: patient identified, IV checked, site marked, risks and benefits discussed, surgical consent, monitors and equipment checked, pre-op evaluation and timeout performed  Epidural Patient position: sitting Prep: ChloraPrep Patient monitoring: heart rate, continuous pulse ox and blood pressure Approach: midline Location: L3-L4 Injection technique: LOR saline  Needle:  Needle type: Tuohy  Needle gauge: 17 G Needle length: 9 cm Needle insertion depth: 7 cm Catheter type: closed end flexible Catheter size: 20 Guage Catheter at skin depth: 12 cm Test dose: negative and 1.5% lidocaine with Epi 1:200 K  Assessment Events: blood not aspirated, injection not painful, no injection resistance and no paresthesia  Additional Notes   Patient identified. Risks/Benefits/Options discussed with patient including but not limited to bleeding, infection, nerve damage, paralysis, failed block, incomplete pain control, headache, blood pressure changes, nausea, vomiting, reactions to medications, itching and postpartum back pain. Confirmed with bedside nurse the patient's most recent platelet count. Confirmed with patient that they are not currently taking any anticoagulation, have any bleeding history or any family history of bleeding disorders. Patient expressed understanding and wished to proceed. All questions were answered. Sterile technique was used throughout the entire procedure. Please see nursing notes for vital signs. Test dose was given through epidural catheter and negative prior to continuing to dose epidural or start infusion. Warning signs of high block given to the patient including shortness of breath, tingling/numbness in hands, complete motor block, or any concerning symptoms with instructions to call for help. Patient was given  instructions on fall risk and not to get out of bed. All questions and concerns addressed with instructions to call with any issues or inadequate analgesia.    Reason for block:procedure for pain

## 2022-06-12 NOTE — Plan of Care (Signed)

## 2022-06-12 NOTE — Progress Notes (Signed)
Notified of patient BP 1hr apart 154/82, 167/88, 151/79. Patient delivered and asymptomatic. Notify if BP >160/100. Procardia 30XL now then begin QD AM 30XL dose as well.  Repeat CBC/CMP ordered for AM

## 2022-06-13 LAB — COMPREHENSIVE METABOLIC PANEL
ALT: 18 U/L (ref 0–44)
AST: 23 U/L (ref 15–41)
Albumin: 2.7 g/dL — ABNORMAL LOW (ref 3.5–5.0)
Alkaline Phosphatase: 83 U/L (ref 38–126)
Anion gap: 4 — ABNORMAL LOW (ref 5–15)
BUN: <5 mg/dL — ABNORMAL LOW (ref 6–20)
CO2: 22 mmol/L (ref 22–32)
Calcium: 8.9 mg/dL (ref 8.9–10.3)
Chloride: 109 mmol/L (ref 98–111)
Creatinine, Ser: 0.57 mg/dL (ref 0.44–1.00)
GFR, Estimated: >60 mL/min (ref >60)
Glucose, Bld: 85 mg/dL (ref 70–99)
Potassium: 3.6 mmol/L (ref 3.5–5.1)
Sodium: 135 mmol/L (ref 135–145)
Total Bilirubin: 0.6 mg/dL (ref 0.3–1.2)
Total Protein: 5.2 g/dL — ABNORMAL LOW (ref 6.5–8.1)

## 2022-06-13 LAB — CBC
HCT: 31.7 % — ABNORMAL LOW (ref 36.0–46.0)
Hemoglobin: 10 g/dL — ABNORMAL LOW (ref 12.0–15.0)
MCH: 27.8 pg (ref 26.0–34.0)
MCHC: 31.5 g/dL (ref 30.0–36.0)
MCV: 88.1 fL (ref 80.0–100.0)
Platelets: 236 10*3/uL (ref 150–400)
RBC: 3.6 MIL/uL — ABNORMAL LOW (ref 3.87–5.11)
RDW: 16.7 % — ABNORMAL HIGH (ref 11.5–15.5)
WBC: 9 10*3/uL (ref 4.0–10.5)
nRBC: 0 % (ref 0.0–0.2)

## 2022-06-13 LAB — RPR: RPR Ser Ql: NONREACTIVE

## 2022-06-13 MED ORDER — NIFEDIPINE ER OSMOTIC RELEASE 30 MG PO TB24
30.0000 mg | ORAL_TABLET | Freq: Every day | ORAL | Status: DC
  Administered 2022-06-14: 30 mg via ORAL
  Filled 2022-06-13: qty 1

## 2022-06-13 NOTE — Progress Notes (Signed)
Patient ID: Tracey Leonard, female   DOB: 04-Apr-1986, 36 y.o.   MRN: [id number] Chart check.   BP range 132-143/78-90; most recent 133/80  Continue with current care.  Procardia 30xl to be given at 6am tomorrow instead of 10am

## 2022-06-13 NOTE — Anesthesia Postprocedure Evaluation (Signed)
Anesthesia Post Note  Patient: YARENIS CERINO  Procedure(s) Performed: AN AD HOC LABOR EPIDURAL     Patient location during evaluation: Mother Baby Anesthesia Type: Epidural Level of consciousness: awake and alert and oriented Pain management: satisfactory to patient Vital Signs Assessment: post-procedure vital signs reviewed and stable Respiratory status: respiratory function stable Cardiovascular status: stable Postop Assessment: no headache, no backache, epidural receding, patient able to bend at knees, no signs of nausea or vomiting, adequate PO intake and able to ambulate Anesthetic complications: no   No notable events documented.  Last Vitals:  Vitals:   06/13/22 0324 06/13/22 0820  BP: (!) 143/75 (!) 144/72  Pulse: 80 80  Resp: 16 16  Temp: 36.8 C (!) 36.4 C  SpO2: 99% 99%    Last Pain:  Vitals:   06/13/22 0820  TempSrc: Oral  PainSc: 8    Pain Goal:                   Lori Popowski

## 2022-06-13 NOTE — Progress Notes (Signed)
Post Partum Day 1 Subjective: up ad lib, voiding, tolerating PO, + flatus, and lochia mild. She reports HA yesterday but none today. She denies scotomata, CP or SOB. She is bonding well with baby. Desires circumcision  Objective: Blood pressure (!) 144/72, pulse 80, temperature (!) 97.5 F (36.4 C), temperature source Oral, resp. rate 16, height 5\' 4"  (1.626 m), weight 92.8 kg, SpO2 99 %, unknown if currently breastfeeding.  Physical Exam:  General: alert, cooperative, and no distress Lochia: appropriate Uterine Fundus: firm Incision: n/a DVT Evaluation: No evidence of DVT seen on physical exam.  Recent Labs    06/12/22 2103 06/13/22 0605  HGB 11.3* 10.0*  HCT 35.9* 31.7*    Assessment/Plan: Plan for discharge tomorrow, Lactation consult, and Circumcision prior to discharge Pt just given her am procardia dose.. Plan to monitor her BP q 4 hrs and adjust medication as needed. PreE precautions    LOS: 1 day   Tracey Leonard W Sonnie Pawloski, DO 06/13/2022, 10:43 AM

## 2022-06-14 LAB — SURGICAL PATHOLOGY

## 2022-06-14 MED ORDER — IBUPROFEN 600 MG PO TABS: 600.0000 mg | ORAL_TABLET | Freq: Four times a day (QID) | ORAL | 0 refills | Status: AC

## 2022-06-14 MED ORDER — NIFEDIPINE ER 30 MG PO TB24: 30.0000 mg | ORAL_TABLET | Freq: Every day | ORAL | 1 refills | Status: AC

## 2022-06-14 NOTE — Progress Notes (Signed)
PPD #2 Doing well Afeb, VSS, BP 120-140/70-80 D/c home on Procardia

## 2022-06-14 NOTE — Discharge Instructions (Signed)
As per discharge pamphlet °

## 2022-06-14 NOTE — Discharge Summary (Signed)
Postpartum Discharge Summary     Patient Name: Tracey Leonard DOB: 1986/04/02 MRN: [id number]  Date of admission: 06/12/2022 Delivery date:06/12/2022  Delivering provider: Deliah Boston  Date of discharge: 06/14/2022  Admitting diagnosis: Preeclampsia [O14.90] Intrauterine pregnancy: [redacted]w[redacted]d     Secondary diagnosis:  Principal Problem:   Preeclampsia     Discharge diagnosis: Term Pregnancy Delivered and Gestational Hypertension                                               Hospital course: Induction of Labor With Vaginal Delivery   36 y.o. yo G2P2002 at [redacted]w[redacted]d was admitted to the hospital 06/12/2022 for induction of labor.  Indication for induction: Gestational hypertension.  Patient had an uncomplicated labor course as follows: Membrane Rupture Time/Date: 1:18 PM ,06/12/2022   Delivery Method:Vaginal, Spontaneous  Episiotomy: None  Lacerations:  None  Details of delivery can be found in separate delivery note.  Patient had a postpartum course complicated by slightly elevated BP controlled with Procardia XL 30 mg daily. Patient is discharged home 06/14/22.  Newborn Data: Birth date:06/12/2022  Birth time:8:23 PM  Gender:Female  Living status:Living  Apgars:8 ,9  Weight:3150 g   Physical exam  Vitals:   06/13/22 2040 06/14/22 0100 06/14/22 0432 06/14/22 0900  BP: 133/80 128/79 120/75 125/75  Pulse: 87 78 80 81  Resp: 20 20 20 18   Temp: 97.9 F (36.6 C) 98 F (36.7 C) 97.9 F (36.6 C) (!) 97.5 F (36.4 C)  TempSrc: Oral Oral Oral Oral  SpO2: 99% 99% 98% 98%  Weight:      Height:       General: alert Lochia: appropriate Uterine Fundus: firm  Labs: Lab Results  Component Value Date   WBC 9.0 06/13/2022   HGB 10.0 (L) 06/13/2022   HCT 31.7 (L) 06/13/2022   MCV 88.1 06/13/2022   PLT 236 06/13/2022      Latest Ref Rng & Units 06/13/2022    6:05 AM  CMP  Glucose 70 - 99 mg/dL 85   BUN 6 - 20 mg/dL <5   Creatinine 0.44 - 1.00 mg/dL 0.57   Sodium 135 - 145  mmol/L 135   Potassium 3.5 - 5.1 mmol/L 3.6   Chloride 98 - 111 mmol/L 109   CO2 22 - 32 mmol/L 22   Calcium 8.9 - 10.3 mg/dL 8.9   Total Protein 6.5 - 8.1 g/dL 5.2   Total Bilirubin 0.3 - 1.2 mg/dL 0.6   Alkaline Phos 38 - 126 U/L 83   AST 15 - 41 U/L 23   ALT 0 - 44 U/L 18    Edinburgh Score:    06/13/2022    4:14 PM  Edinburgh Postnatal Depression Scale Screening Tool  I have been able to laugh and see the funny side of things. 0  I have looked forward with enjoyment to things. 1  I have blamed myself unnecessarily when things went wrong. 2  I have been anxious or worried for no good reason. 0  I have felt scared or panicky for no good reason. 0  Things have been getting on top of me. 2  I have been so unhappy that I have had difficulty sleeping. 2  I have felt sad or miserable. 2  I have been so unhappy that I have been crying. 0  The  thought of harming myself has occurred to me. 0  Edinburgh Postnatal Depression Scale Total 9      After visit meds:  Allergies as of 06/14/2022       Reactions   Amoxicillin Rash        Medication List     STOP taking these medications    aspirin 81 MG chewable tablet   Clobetasol Propionate 0.025 % Crea   Crisaborole 2 % Oint Commonly known as: Eucrisa   fluticasone 50 MCG/ACT nasal spray Commonly known as: FLONASE   guaifenesin 100 MG/5ML syrup Commonly known as: ROBITUSSIN   magic mouthwash w/lidocaine Soln   metroNIDAZOLE 0.75 % vaginal gel Commonly known as: METROGEL       TAKE these medications    calcium carbonate 500 MG chewable tablet Commonly known as: TUMS - dosed in mg elemental calcium Chew 1 tablet by mouth daily.   cetirizine 10 MG tablet Commonly known as: ZyrTEC Allergy Take 1 tablet (10 mg total) by mouth daily.   ferrous sulfate 324 MG Tbec Take 324 mg by mouth.   ibuprofen 600 MG tablet Commonly known as: ADVIL Take 1 tablet (600 mg total) by mouth every 6 (six) hours.    montelukast 10 MG tablet Commonly known as: SINGULAIR Take 1 tablet (10 mg total) by mouth at bedtime.   MULTIPLE VITAMINS/WOMENS PO Take by mouth.   NIFEdipine 30 MG 24 hr tablet Commonly known as: ADALAT CC Take 1 tablet (30 mg total) by mouth daily. Start taking on: June 15, 2022   Prenatal Vitamins 0.8 MG tablet Take 1 tablet by mouth daily.   Vitafol Gummies 3.33-0.333-34.8 MG Chew Chew 1 tablet by mouth daily before breakfast.         Discharge home in stable condition Infant Feeding: Breast Infant Disposition:home with mother Discharge instruction: per After Visit Summary and Postpartum booklet. Activity: Advance as tolerated. Pelvic rest for 6 weeks.  Diet: routine diet Postpartum Appointment: 5 days Follow up Visit:  Follow-up Information     Andraya Frigon, Sherren Mocha, MD. Schedule an appointment as soon as possible for a visit.   Specialty: Obstetrics and Gynecology Why: for BP check Contact information: [address] [place] [address] [place] [address] [phone]                     06/14/2022 Clarene Duke, MD

## 2022-06-17 ENCOUNTER — Inpatient Hospital Stay (HOSPITAL_COMMUNITY): Payer: Medicaid Other

## 2022-06-20 ENCOUNTER — Telehealth (HOSPITAL_COMMUNITY): Payer: Self-pay | Admitting: *Deleted

## 2022-06-20 NOTE — Telephone Encounter (Signed)
Patient voiced no questions or concerns regarding her health at this time. EPDS=8. Patient voiced no questions or concerns regarding infant at this time. Patient reports infant sleeps in a bassinet on his back. RN reviewed ABCs of safe sleep. Patient verbalized understanding. Patient requested RN email information on hospital's virtual postpartum classes and support groups. Email sent. Erline Levine, RN, 06/20/22, [phone]
